# Patient Record
Sex: Male | Born: 1955 | Race: White | Hispanic: No | Marital: Married | Smoking: Former smoker
Health system: Southern US, Community
[De-identification: ages and names within clinical notes are randomized; demographics above are authoritative.]

## PROBLEM LIST (undated history)

## (undated) DIAGNOSIS — T7840XA Allergy, unspecified, initial encounter: Secondary | ICD-10-CM

## (undated) DIAGNOSIS — J45909 Unspecified asthma, uncomplicated: Secondary | ICD-10-CM

## (undated) DIAGNOSIS — Z9989 Dependence on other enabling machines and devices: Secondary | ICD-10-CM

## (undated) DIAGNOSIS — E669 Obesity, unspecified: Secondary | ICD-10-CM

## (undated) DIAGNOSIS — I10 Essential (primary) hypertension: Secondary | ICD-10-CM

## (undated) DIAGNOSIS — R519 Headache, unspecified: Secondary | ICD-10-CM

## (undated) DIAGNOSIS — R0602 Shortness of breath: Secondary | ICD-10-CM

## (undated) DIAGNOSIS — R42 Dizziness and giddiness: Secondary | ICD-10-CM

## (undated) DIAGNOSIS — E785 Hyperlipidemia, unspecified: Secondary | ICD-10-CM

## (undated) DIAGNOSIS — G4730 Sleep apnea, unspecified: Secondary | ICD-10-CM

## (undated) DIAGNOSIS — R079 Chest pain, unspecified: Secondary | ICD-10-CM

## (undated) DIAGNOSIS — G4733 Obstructive sleep apnea (adult) (pediatric): Secondary | ICD-10-CM

## (undated) DIAGNOSIS — F419 Anxiety disorder, unspecified: Secondary | ICD-10-CM

## (undated) DIAGNOSIS — U071 COVID-19: Secondary | ICD-10-CM

## (undated) DIAGNOSIS — E119 Type 2 diabetes mellitus without complications: Secondary | ICD-10-CM

## (undated) DIAGNOSIS — K219 Gastro-esophageal reflux disease without esophagitis: Secondary | ICD-10-CM

## (undated) HISTORY — DX: Obesity, unspecified: E66.9

## (undated) HISTORY — DX: Anxiety disorder, unspecified: F41.9

## (undated) HISTORY — DX: Chest pain, unspecified: R07.9

## (undated) HISTORY — PX: CHOLECYSTECTOMY: SHX55

## (undated) HISTORY — DX: Unspecified asthma, uncomplicated: J45.909

## (undated) HISTORY — DX: Gastro-esophageal reflux disease without esophagitis: K21.9

## (undated) HISTORY — DX: Dizziness and giddiness: R42

## (undated) HISTORY — DX: Type 2 diabetes mellitus without complications: E11.9

## (undated) HISTORY — DX: Allergy, unspecified, initial encounter: T78.40XA

## (undated) HISTORY — PX: APPENDECTOMY: SHX54

## (undated) HISTORY — DX: Dependence on other enabling machines and devices: Z99.89

## (undated) HISTORY — DX: Shortness of breath: R06.02

## (undated) HISTORY — PX: OTHER SURGICAL HISTORY: SHX169

## (undated) HISTORY — DX: Hyperlipidemia, unspecified: E78.5

## (undated) HISTORY — DX: Sleep apnea, unspecified: G47.30

## (undated) HISTORY — DX: Obstructive sleep apnea (adult) (pediatric): G47.33

---

## 1997-09-08 ENCOUNTER — Inpatient Hospital Stay (HOSPITAL_COMMUNITY): Admission: RE | Admit: 1997-09-08 | Discharge: 1997-09-09 | Payer: Self-pay | Admitting: Neurosurgery

## 2006-07-01 ENCOUNTER — Ambulatory Visit (HOSPITAL_COMMUNITY): Admission: RE | Admit: 2006-07-01 | Discharge: 2006-07-01 | Payer: Self-pay | Admitting: General Surgery

## 2006-07-01 ENCOUNTER — Encounter (INDEPENDENT_AMBULATORY_CARE_PROVIDER_SITE_OTHER): Payer: Self-pay | Admitting: Specialist

## 2009-04-07 ENCOUNTER — Encounter: Admission: RE | Admit: 2009-04-07 | Discharge: 2009-04-07 | Payer: Self-pay | Admitting: Otolaryngology

## 2010-09-27 NOTE — Op Note (Signed)
NAME:  Andrew Dyer, Andrew Dyer NO.:  [id number]   MEDICAL RECORD NO.:  [id number]          PATIENT TYPE:  AMB   LOCATION:  DAY                          FACILITY:  [facility]   PHYSICIAN:  Anselm Pancoast. Weatherly, M.D.DATE OF BIRTH:  06-01-1955   DATE OF PROCEDURE:  07/01/2006  DATE OF DISCHARGE:                               OPERATIVE REPORT   PREOPERATIVE DIAGNOSIS:  Chronic cholecystitis.   POSTOPERATIVE DIAGNOSIS:  Chronic cholecystitis.   OPERATIONS:  Laparoscopic cholecystectomy with cholangiogram.   ANESTHESIA:  General anesthesia.   SURGEON:  Anselm Pancoast. Zachery Dakins, M.D.   ASSISTANT:  Ardeth Sportsman, MD.   INDICATIONS FOR PROCEDURE:  Andrew Dyer is an extremely heavy  individual, I have managed his wife's surgical problems in the last  several years; and the patient has had episodes of epigastric pain,  right upper quadrant, usually occurs postprandially, off-and-on for  approximately a year.  He had problems with what sounds like infectious  colitis about a year ago, lost weight; and he has gained that back.  He  had ultrasounds that have not shown stones, but he has had a  hepatobiliary scan that shows a very low ejection fracture; and Dr.  Charm Barges referred him to [name] for a cholecystectomy.  The patient has had a  previous appendectomy; and is Crestor 10 mg and Avapro.  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  He is a large individual.  His laboratory  studies showed a bilirubin 2.1, but otherwise was unremarkable.  He  weighs about 240 pounds; and he is about 5 feet 8 inches.  Preoperatively he was not acutely tender; and he was given 3 grams of  Unasyn and he has PAS stockings.   The patient was taken to the operative suite.  Induction of general  anesthesia, endotracheal tube. oral tube into the stomach, and then we  shaved  the subxiphoid around the umbilicus; and a little small area in  the right upper quadrant where we put the 5-mm trocars.  The abdomen was  then prepped with Betadine solution and draped in sterile manner.  I  made a small incision below the umbilicus, and I could feel a little  fascial defect right at the umbilicus; and we kind of carefully worked  through this, so that we just kind of opened the posterior fascia; and  could get a Kelly in through the posterior peritoneum; and then put two  tracts of sutures of #0 Vicryl upwards and downwards.   The Hasson cannula was introduced; and the patient's abdomen was just a  sea of fatty tissue with a very generous omentum; and you were not  actually able to see the gallbladder until the liver was elevated.  The  upper 10-mm trocar was placed in the subxiphoid area; and the lateral 5-  mm trocars were placed in the appropriate lateral position by Dr. Michaell Cowing.  In trying to grasp the gallbladder, it was trying to separate from  the  liver at the inferior; and we were kind of careful on not doing any type  of vigorous retraction; and because of his size it was necessary to put  him in a pretty steep reverse Trendelenburg rotated to the left.  In  this position, we could see the very fatty covered liver; and I opened  in the area I thought would be the cystic liver junction; and dissected  down; and could actually see the junction of the two.  The little artery  was also visualized; and it was a single clipped and then a clip placed  across the cystic duct gallbladder junction.  A small opening was made  just proximal and a Reddick catheter was introduced; in the proximal  cystic duct.  This was probably about a 3 cm cystic duct, that was kind  of long, and then certainly could be that of biliary dyskinesia.  There  was good flow into the duodenum.  You could see a little air bubble that  went in that went on out later.   The catheter was removed, the  cystic duct was triply clipped and then  divided.  We then put 3 clips around the cystic artery and divided it;  and then there was a posterior branch the cystic artery that was also  doubly clipped proximally and divided.  The gallbladder was freed from  its bed.  It had a lot of fatty tissue around it; and hemostasis  required a little more cauterization than usual because of this kind of  mesentery and fatty omentum-type blood supply to the gallbladder area.  The little area where the gallbladder had kind of separated from the  free liver bed towards the tip was cauterized after the gallbladder was  freed from its bed.  I placed the gallbladder in the EndoCatch bag and  irrigated, aspirated; and was comfortable that we had good hemostasis.   The camera was then switched to the upper 10-mm port.  No drains were  placed; and then the bag containing the gallbladder was grasped with  __________  and then brought out through the umbilicus.  The  gallbladder, itself, was not all that large; and whether there is any  actual small stones that are not, will be left to the pathologist.  The  reinspection revealed good hemostasis.  I put an #0 Vicryl figure-of-  eight in the fascia, in addition to the two sutures that had already  been placed superiorly and inferiorly.  I tied all of them and then  anesthetized fascia.  The 5-mm trocars were removed under direct vision,  and the upper 10-mm trocar withdrawn.  I did place a figure-of-eight in  the fascia; and the upper since you could drop a handle of pickups  easily through the fascia defect.   The patient subcutaneous wounds were closed with 4-0 Vicryl.  Benzoin  and Steri-Strips on the skin.  The patient required a lot of anesthetic  agents to keep him paralyzed during surgery; and he is thinking about  possibly going home; and I want to make sure that he is not nauseated; and he is definitely recovering from the anesthesia before we elect  to  let him go home.  He will be out-of-work for at least a week; and  hopefully this will relieve his symptoms that he has had these episodes  of biliary colic off-and-on over the last year.           ______________________________  Anselm Pancoast. Zachery Dakins, M.D.     WJW/MEDQ  D:  07/01/2006  T:  07/01/2006  Job:  [id number]

## 2010-12-02 ENCOUNTER — Inpatient Hospital Stay (HOSPITAL_COMMUNITY)
Admission: EM | Admit: 2010-12-02 | Discharge: 2010-12-05 | DRG: 690 | Disposition: A | Discharge status: Home / Self Care | Payer: Managed Care, Other (non HMO) | Attending: Internal Medicine | Admitting: Internal Medicine

## 2010-12-02 ENCOUNTER — Encounter (HOSPITAL_COMMUNITY): Payer: Self-pay

## 2010-12-02 ENCOUNTER — Emergency Department (HOSPITAL_COMMUNITY): Payer: Managed Care, Other (non HMO)

## 2010-12-02 DIAGNOSIS — E119 Type 2 diabetes mellitus without complications: Secondary | ICD-10-CM | POA: Diagnosis present

## 2010-12-02 DIAGNOSIS — Z79899 Other long term (current) drug therapy: Secondary | ICD-10-CM

## 2010-12-02 DIAGNOSIS — I10 Essential (primary) hypertension: Secondary | ICD-10-CM | POA: Diagnosis present

## 2010-12-02 DIAGNOSIS — N179 Acute kidney failure, unspecified: Secondary | ICD-10-CM | POA: Diagnosis present

## 2010-12-02 DIAGNOSIS — G4733 Obstructive sleep apnea (adult) (pediatric): Secondary | ICD-10-CM | POA: Diagnosis present

## 2010-12-02 DIAGNOSIS — N10 Acute tubulo-interstitial nephritis: Principal | ICD-10-CM | POA: Diagnosis present

## 2010-12-02 DIAGNOSIS — N138 Other obstructive and reflux uropathy: Secondary | ICD-10-CM | POA: Diagnosis present

## 2010-12-02 DIAGNOSIS — N401 Enlarged prostate with lower urinary tract symptoms: Secondary | ICD-10-CM | POA: Diagnosis present

## 2010-12-02 DIAGNOSIS — J45909 Unspecified asthma, uncomplicated: Secondary | ICD-10-CM | POA: Diagnosis present

## 2010-12-02 DIAGNOSIS — E7800 Pure hypercholesterolemia, unspecified: Secondary | ICD-10-CM | POA: Diagnosis present

## 2010-12-02 DIAGNOSIS — D649 Anemia, unspecified: Secondary | ICD-10-CM | POA: Diagnosis present

## 2010-12-02 DIAGNOSIS — Z7982 Long term (current) use of aspirin: Secondary | ICD-10-CM

## 2010-12-02 DIAGNOSIS — A498 Other bacterial infections of unspecified site: Secondary | ICD-10-CM | POA: Diagnosis present

## 2010-12-02 DIAGNOSIS — E785 Hyperlipidemia, unspecified: Secondary | ICD-10-CM | POA: Diagnosis present

## 2010-12-02 DIAGNOSIS — F411 Generalized anxiety disorder: Secondary | ICD-10-CM | POA: Diagnosis present

## 2010-12-02 DIAGNOSIS — G43909 Migraine, unspecified, not intractable, without status migrainosus: Secondary | ICD-10-CM | POA: Diagnosis present

## 2010-12-02 DIAGNOSIS — R339 Retention of urine, unspecified: Secondary | ICD-10-CM | POA: Diagnosis present

## 2010-12-02 DIAGNOSIS — N410 Acute prostatitis: Secondary | ICD-10-CM | POA: Diagnosis present

## 2010-12-02 HISTORY — DX: Essential (primary) hypertension: I10

## 2010-12-02 LAB — COMPREHENSIVE METABOLIC PANEL
AST: 39 U/L — ABNORMAL HIGH (ref 0–37)
BUN: 13 mg/dL (ref 6–23)
Chloride: 101 mEq/L (ref 96–112)
GFR calc Af Amer: >60 mL/min (ref >60)
GFR calc non Af Amer: >60 mL/min (ref >60)
Potassium: 4 mEq/L (ref 3.5–5.1)
Sodium: 136 mEq/L (ref 135–145)
Total Protein: 6.8 g/dL (ref 6.0–8.3)

## 2010-12-02 LAB — DIFFERENTIAL
Basophils Absolute: 0 10*3/uL (ref 0.0–0.1)
Basophils Relative: 0 % (ref 0–1)
Eosinophils Absolute: 0 10*3/uL (ref 0.0–0.7)
Monocytes Absolute: 1.4 10*3/uL — ABNORMAL HIGH (ref 0.1–1.0)
Neutrophils Relative %: 89 % — ABNORMAL HIGH (ref 43–77)

## 2010-12-02 LAB — PROCALCITONIN: Procalcitonin: 1.03 ng/mL

## 2010-12-02 LAB — CBC
Hemoglobin: 13.8 g/dL (ref 13.0–17.0)
MCHC: 34.6 g/dL (ref 30.0–36.0)
MCV: 83 fL (ref 78.0–100.0)
RBC: 4.81 MIL/uL (ref 4.22–5.81)
RDW: 13 % (ref 11.5–15.5)
WBC: 19.1 10*3/uL — ABNORMAL HIGH (ref 4.0–10.5)

## 2010-12-02 LAB — POCT I-STAT, CHEM 8
BUN: 10 mg/dL (ref 6–23)
Calcium, Ion: 1.1 mmol/L — ABNORMAL LOW (ref 1.12–1.32)
Chloride: 99 mEq/L (ref 96–112)
Creatinine, Ser: 1.3 mg/dL (ref 0.50–1.35)
Glucose, Bld: 171 mg/dL — ABNORMAL HIGH (ref 70–99)
Hemoglobin: 14.3 g/dL (ref 13.0–17.0)
Potassium: 4 mEq/L (ref 3.5–5.1)

## 2010-12-02 LAB — URINALYSIS, ROUTINE W REFLEX MICROSCOPIC: Glucose, UA: NEGATIVE mg/dL

## 2010-12-02 LAB — MAGNESIUM: Magnesium: 1.6 mg/dL (ref 1.5–2.5)

## 2010-12-02 LAB — URINE MICROSCOPIC-ADD ON

## 2010-12-03 LAB — BASIC METABOLIC PANEL
BUN: 18 mg/dL (ref 6–23)
Calcium: 8 mg/dL — ABNORMAL LOW (ref 8.4–10.5)
GFR calc Af Amer: >60 mL/min (ref >60)
GFR calc non Af Amer: 57 mL/min — ABNORMAL LOW (ref >60)
Glucose, Bld: 155 mg/dL — ABNORMAL HIGH (ref 70–99)
Potassium: 4.7 mEq/L (ref 3.5–5.1)
Sodium: 135 mEq/L (ref 135–145)

## 2010-12-03 LAB — LIPID PANEL
HDL: 34 mg/dL — ABNORMAL LOW (ref >39)
Triglycerides: 116 mg/dL (ref <150)
VLDL: 23 mg/dL (ref 0–40)

## 2010-12-03 LAB — CBC
HCT: 36.4 % — ABNORMAL LOW (ref 39.0–52.0)
Hemoglobin: 12.3 g/dL — ABNORMAL LOW (ref 13.0–17.0)
MCHC: 33.8 g/dL (ref 30.0–36.0)

## 2010-12-03 LAB — HEMOGLOBIN A1C
Hgb A1c MFr Bld: 8.9 % — ABNORMAL HIGH (ref <5.7)
Mean Plasma Glucose: 209 mg/dL — ABNORMAL HIGH (ref <117)

## 2010-12-03 LAB — TSH: TSH: 1.351 u[IU]/mL (ref 0.350–4.500)

## 2010-12-03 LAB — GLUCOSE, CAPILLARY
Glucose-Capillary: 139 mg/dL — ABNORMAL HIGH (ref 70–99)
Glucose-Capillary: 134 mg/dL — ABNORMAL HIGH (ref 70–99)

## 2010-12-03 LAB — DIFFERENTIAL
Basophils Relative: 0 % (ref 0–1)
Eosinophils Relative: 0 % (ref 0–5)

## 2010-12-03 NOTE — H&P (Signed)
NAME:  Andrew Dyer, Andrew Dyer NO.:  [id number]  MEDICAL RECORD NO.:  [id number]  LOCATION:  [facility]                         FACILITY:  [facility]  PHYSICIAN:  Standley Dakins, MD   DATE OF BIRTH:  17-Apr-1956  DATE OF ADMISSION:  12/02/2010 DATE OF DISCHARGE:                             HISTORY & PHYSICAL   PRIMARY CARE PHYSICIAN:  Samuel Jester, MD in [place], [place].  CHIEF COMPLAINT:  Chills and fullness in the bladder.  HISTORY OF PRESENT ILLNESS:  This patient is a pleasant 55 year old gentleman who reports that last night he started experiencing chills and malaise and flu-like symptoms.  He reports that he has felt a fullness in the bladder for the past several days.  He reports he has been experiencing urinary frequency with only a small amount of urinary output for the past several days.  He reported that he has been experiencing nausea and a feeling of being bloated in the abdomen for the past several days.  He reports no fever, but has been concerned about the chills that he started experiencing last night.  He reports that he has been drinking large amounts of caffeine for the past 7-10 days.  He reports no emesis.  He reports that he decided to come to the emergency department because of the chills.  The patient was seen in the emergency department and found to have a fever of 102.9.  His bladder was mildly distended and tender to palpation.  He was discovered to have an urinary tract infection and was clinically dehydrated.  Hospital admission was requested for further evaluation and treatment.  The patient has a history of hypertension, migraine headaches, and anxiety.  PAST MEDICAL HISTORY: 1. Allergic rhinitis. 2. Vertigo. 3. Migraine headaches. 4. Hyperlipidemia. 5. Hypertension. 6. Osteoarthritis. 7. Asthma. 8. Obstructive sleep apnea. 9. Morbid obesity.  PAST SURGICAL HISTORY:  Status post cholecystectomy.  HOME MEDICATIONS: 1. Advil 200 mg 3  tablets p.o. daily p.r.n. arthritis pain. 2. Multivitamins one p.o. daily. 3. ProAir HFA inhaler 2 puffs every 6 hours p.r.n. shortness of     breath. 4. Visine allergy eyedrops 1 drop daily as needed. 5. Maxalt ODT 1 tablet p.o. daily as needed. 6. Aspirin 81 mg p.o. daily. 7. Alprazolam 0.25 mg 1 tablet daily as needed for anxiety. 8. Losartan 50 mg 1 tablet p.o. daily. 9. Crestor 10 mg one p.o. daily. 10.Fexofenadine 180 mg one p.o. daily. 11.Omeprazole 40 mg one p.o. daily. 12.Meclizine 25 mg one p.o. every 6 hours p.r.n. dizziness.  ALLERGIES:  No known drug allergies.  FAMILY HISTORY:  The patient reports that his father died of pneumonia at the age of 59.  The patient reports that his brother died of an aneurysm at the age of 54.  SOCIAL HISTORY:  The patient is married with children.  He does not use tobacco, alcohol, or recreational drugs.  He works full-time.  REVIEW OF SYSTEMS:  Significant for all of the positives listed in the HPI, otherwise all systems reviewed completely and reported as negative.  PHYSICAL EXAMINATION:  CURRENT VITAL SIGNS:  Temperature 102.9 rectal, pulse 99, respirations 20, blood pressure 144/73, pulse ox is 96% on room air.  GENERAL:  This is a well-developed, well-nourished male.  He is awake, alert, cooperative in no distress. HEENT:  Normocephalic, atraumatic.  Mucous membranes dry.  Normal dentition. NECK:  Supple.  Thyroid soft.  No nodules or masses palpated. LUNGS:  Bilateral breath sounds.  Clear to auscultation. CARDIAC:  Shallow distant breath sounds and distant heart sounds. Normal S1 and S2 sounds. ABDOMEN:  Obese, soft, nondistended.  Mild suprapubic tenderness with deep palpation.  No guarding or rebound tenderness.  Bowel sounds present. EXTREMITIES:  No pretibial edema, cyanosis, or clubbing.  The patient does have a small abrasion on the left lower extremity.  LABORATORY DATA:  Lactic acid 1.6, procalcitonin 1.03.  White  blood cell count 19.1, hemoglobin 13.8, hematocrit 39.9, platelet count 205. Sodium 137, potassium 4.0, chloride 99, BUN 10, creatinine 1.30, glucose 171, calcium 1.10.  Urinalysis reveals 21-50 red blood cells per high- power field, white blood cells too numerous to count, many bacteria.  IMAGING STUDIES:  CT of the abdomen and pelvis without contrast negative for urinary tract stones, mild stranding about the kidneys is nonspecific, but could be due to pyelonephritis, marked enlargement of the prostate gland and fatty infiltration of the liver.  IMPRESSION: 1. Urinary tract infection. 2. Prostatitis. 3. Question of pyelonephritis. 4. Hypertension. 5. Obstructive sleep apnea. 6. Morbid obesity. 7. Hyperlipidemia. 8. Allergic rhinitis. 9. Anxiety disorder. 10.Chronic intermittent vertigo. 11.Gastroesophageal reflux disease. 12.Asthma. 13.Osteoarthritis and weightbearing arthropathy. 14.Moderate clinical dehydration. 15.Recent abrasion by a metal substance.  The patient is due for     tetanus vaccine 16.Obstructive sleep apnea. 17.Hyperglycemia.  PLAN: 1. The patient is going to be admitted into the hospital for IV  antibiotic treatment. 2. Aggressive IV fluid hydration has been ordered. 3. Tetanus vaccine has been ordered. 4. A Foley catheter has been placed and we will start the patient on     Flomax. 5. Resume home medications for hypertension and hyperlipidemia. 6. Resume nightly CPAP treatments in the hospital. 7. Gastrointestinal prophylaxis with Protonix p.o. daily. 8. We will monitor electrolytes closely and monitor leukocytosis     trend. 9. Resume home medications for migraine headaches as needed. 10.Check urine for GC and Chlamydia as the patient had complained of     burning with urination. 11.Continue to monitor and make adjustments to medical care as     required. 12.Check Hemoglobin A1c to screen for diabetes mellitus.     Standley Dakins,  MD     CJ/MEDQ  D:  12/02/2010  T:  12/02/2010  Job:  [id number]  cc:   Samuel Jester, MD  Electronically Signed by Standley Dakins  on 12/03/2010 11:06:48 PM

## 2010-12-04 LAB — COMPREHENSIVE METABOLIC PANEL
Albumin: 2.7 g/dL — ABNORMAL LOW (ref 3.5–5.2)
BUN: 11 mg/dL (ref 6–23)
Calcium: 8.3 mg/dL — ABNORMAL LOW (ref 8.4–10.5)
Creatinine, Ser: 0.96 mg/dL (ref 0.50–1.35)
GFR calc Af Amer: >60 mL/min (ref >60)
Glucose, Bld: 126 mg/dL — ABNORMAL HIGH (ref 70–99)
Potassium: 3.9 mEq/L (ref 3.5–5.1)
Total Protein: 6.2 g/dL (ref 6.0–8.3)

## 2010-12-04 LAB — CBC
HCT: 34.7 % — ABNORMAL LOW (ref 39.0–52.0)
Hemoglobin: 11.8 g/dL — ABNORMAL LOW (ref 13.0–17.0)
MCH: 28.8 pg (ref 26.0–34.0)
MCHC: 34 g/dL (ref 30.0–36.0)
MCV: 84.6 fL (ref 78.0–100.0)
RDW: 13.4 % (ref 11.5–15.5)

## 2010-12-04 LAB — GLUCOSE, CAPILLARY
Glucose-Capillary: 122 mg/dL — ABNORMAL HIGH (ref 70–99)
Glucose-Capillary: 106 mg/dL — ABNORMAL HIGH (ref 70–99)
Glucose-Capillary: 107 mg/dL — ABNORMAL HIGH (ref 70–99)

## 2010-12-04 LAB — URINE CULTURE: Culture  Setup Time: 201207231947

## 2010-12-05 LAB — CBC
Hemoglobin: 12.1 g/dL — ABNORMAL LOW (ref 13.0–17.0)
MCH: 28.9 pg (ref 26.0–34.0)
MCHC: 34.2 g/dL (ref 30.0–36.0)
MCV: 84.5 fL (ref 78.0–100.0)
Platelets: 182 10*3/uL (ref 150–400)
RBC: 4.19 MIL/uL — ABNORMAL LOW (ref 4.22–5.81)

## 2010-12-05 LAB — DIFFERENTIAL
Basophils Relative: 0 % (ref 0–1)
Eosinophils Absolute: 0.2 10*3/uL (ref 0.0–0.7)
Lymphs Abs: 1 10*3/uL (ref 0.7–4.0)
Monocytes Absolute: 0.7 10*3/uL (ref 0.1–1.0)
Monocytes Relative: 10 % (ref 3–12)
Neutrophils Relative %: 73 % (ref 43–77)

## 2010-12-05 LAB — GLUCOSE, CAPILLARY
Glucose-Capillary: 104 mg/dL — ABNORMAL HIGH (ref 70–99)
Glucose-Capillary: 128 mg/dL — ABNORMAL HIGH (ref 70–99)

## 2010-12-08 LAB — CULTURE, BLOOD (ROUTINE X 2)
Culture  Setup Time: 201207231840
Culture: NO GROWTH

## 2010-12-24 NOTE — Discharge Summary (Signed)
NAME:  Andrew Dyer, Andrew Dyer NO.:  [id number]  MEDICAL RECORD NO.:  [id number]  LOCATION:  5532                         FACILITY:  [facility]  PHYSICIAN:  Marcellus Scott, MD     DATE OF BIRTH:  1955-11-26  DATE OF ADMISSION:  12/02/2010 DATE OF DISCHARGE:  12/05/2010                              DISCHARGE SUMMARY   PRIMARY CARE PHYSICIAN:  Samuel Jester, MD  UROLOGIST:  Valetta Fuller, MD  DISCHARGE DIAGNOSES: 1. Escherichia coli acute pyelonephritis. 2. Acute prostatitis. 3. Prostatomegaly, benign prostatic hypertrophy with associated     urinary retention. 4. Newly diagnosed type 2 diabetes mellitus. 5. Anemia. 6. Obstructive sleep apnea, on nightly continuous positive airway     pressure. 7. Hypertension. 8. Anxiety disorder. 9. Hyperlipidemia. 10.History of allergic rhinitis. 11.History of vertigo. 12.History of migraine. 13.History of asthma. 14.Morbid obesity.  DISCHARGE MEDICATIONS: 1. Tylenol 650 mg p.o. q.4 h. p.r.n. pain. 2. Bactrim DS 1 tablet p.o. b.i.d. to complete a total 4 weeks course     of antibiotics. 3. Metformin 500 mg p.o. b.i.d. 4. Tamsulosin 0.4 mg p.o. daily. 5. Advil 600 mg p.o. daily p.r.n. pain. 6. Alprazolam 0.25 mg p.o. daily p.r.n. for anxiety. 7. Aspirin 81 mg p.o. daily. 8. Crestor 10 mg p.o. daily. 9. Fexofenadine 180 mg p.o. daily. 10.Losartan 50 mg p.o. daily. 11.Maxalt ODT 1 tablet p.o. daily p.r.n. for migraines. 12.Meclizine 25 mg p.o. q.6 h. p.r.n. for dizziness. 13.Multivitamins 1 tablet p.o. daily. 14.Omeprazole 40 mg p.o. daily. 15.ProAir 2 puffs inhaled q.6 h. p.r.n. for dyspnea. 16.Visine 1 drop in both eyes daily p.r.n. for allergies.  Discontinued Medication:  Aspirin 325 mg.  PROCEDURES:  Insertion of Foley catheter in the emergency room.  IMAGING: 1. CT of the abdomen and pelvis without contrast on December 02, 2010.     Impression:     a.     Negative for urinary tract stone.  Mild stranding about  the      kidneys is nonspecific but could be due to pyelonephritis.      Finding is most notable about the lower pole of the right kidney.     b.     Marked enlargement of the prostate gland.     c.     Fatty infiltration of the liver.  LABORATORY DATA:  CBC showed hemoglobin 12.1, hematocrit 35.4, white blood cells 7.1, platelets 182.  Urine cultures showed E. coli greater than 100,000 colonies per mL which is pansensitive.  Hepatic panel shows a bilirubin 1.6, albumin 2.7, otherwise within normal limits.  Blood cultures x2 are no growth to date.  Lipid panel only shows HDL of 34, hemoglobin A1c of 8.9, TSH 1.351.  Procalcitonin 1.03.  Venous lactate 1.6.  Admitting white blood cell of 19.1 thousand.  Admitting creatinine of 1.3.  Urinalysis showed too numerous to count white blood cells and many bacteria.  CONSULTATIONS:  Phone consultation with Dr. Mena Goes, with [facility].  DIET:  Diabetic diet.  ACTIVITIES:  Ad lib.  TODAY'S COMPLAINTS:  The patient indicates that he is feeling much better.  He is eating a small amount.  He is ambulating to the bathroom steadily.  He had some loose stools after each meal up to yesterday but that is improving.  PHYSICAL EXAMINATION:  GENERAL:  The patient is in no obvious distress, obese. VITAL SIGNS:  Temperature 97.7 degrees Fahrenheit, pulse 66 per minute and regular, respirations 16 per minute, blood pressure 137/84 mmHg, and saturating at 95% on room air. RESPIRATORY:  Clear. CARDIOVASCULAR:  First and second heart sounds heard and regular. ABDOMEN:  Obese, nontender, soft and normal bowel sounds heard. CENTRAL NERVOUS SYSTEM:  The patient is awake, alert, oriented x3 with no focal neurological deficits.  HOSPITAL COURSE:  Mr. Hansson is a 55 year old gentleman with history of hypertension, obstructive sleep apnea on nightly CPAP, hyperlipidemia, and asthma who presented with bladder fullness, urinary frequency, and  decreased urine stream, malaise, and flu-like symptoms. He was found to have a fever of 102.9 degrees Fahrenheit and features of urinary retention.  The [facility] were requested to admitted him for further evaluation and management.  1. Acute E. coli pyelonephritis.  The patient was admitted to the     hospital.  His cultures were sent off, and he was empirically     started on IV Rocephin and today is day #3.  He will complete a     total of 7 days of antibiotics for this indication. 2. Acute prostatitis.  The patient had some deep-seated rectal pain on     admission which has significantly improved.  He has a Foley     catheter.  His case was discussed with Dr. Mena Goes from [facility] yesterday.  Dr. Mena Goes recommended discharging the     patient with the Foley catheter and continue the Flomax which was     started in the hospital.  He also recommends a total 4 weeks of     antibiotic therapy and Dr. Ellin Goodie office will call with an     appointment for the patient to be seen by them in a week's time. 3. Newly diagnosed type 2 diabetes mellitus.  The patient has received     diabetes education.  His CBGs ranged between 104-139 mg/dL and has     not required significant amounts of sliding scale insulin.  He will     be discharged on metformin which should help with his obesity as     well as diabetes. 4. Anemia, stable. 5. Obstructive sleep apnea.  Continue nightly CPAP. 6. Hypertension.  Controlled. 7. Mild acute renal failure on admission, possibly secondary to     urinary retention and dehydration, resolved.  DISPOSITION:  The patient is discharged home in stable condition.  FOLLOWUP RECOMMENDATIONS: 1. With Dr. Samuel Jester.  The patient or family is to call for an     appointment to be seen in 7-10 days' time. 2. With Dr. Barron Alvine.  Dr. Ellin Goodie office will call for an     appointment to be seen in a week's time but the family or the     patient is  also to call if they do not hear from MD's office.  Time taken in coordinating this discharge is 40 minutes.     Marcellus Scott, MD     AH/MEDQ  D:  12/05/2010  T:  12/06/2010  Job:  [id number]  cc:   Samuel Jester, MD Valetta Fuller, MD  Electronically Signed by Marcellus Scott MD on 12/24/2010 11:20:32 PM

## 2012-08-04 ENCOUNTER — Other Ambulatory Visit (HOSPITAL_COMMUNITY): Payer: Self-pay | Admitting: Cardiovascular Disease

## 2012-08-04 DIAGNOSIS — R079 Chest pain, unspecified: Secondary | ICD-10-CM

## 2012-08-17 ENCOUNTER — Ambulatory Visit (HOSPITAL_COMMUNITY): Payer: Managed Care, Other (non HMO)

## 2012-08-17 ENCOUNTER — Encounter (HOSPITAL_COMMUNITY): Payer: Managed Care, Other (non HMO)

## 2012-09-03 ENCOUNTER — Ambulatory Visit (HOSPITAL_COMMUNITY)
Admission: RE | Admit: 2012-09-03 | Discharge: 2012-09-03 | Disposition: A | Discharge status: Home / Self Care | Payer: BC Managed Care – PPO | Source: Ambulatory Visit | Attending: Cardiovascular Disease | Admitting: Cardiovascular Disease

## 2012-09-03 DIAGNOSIS — R079 Chest pain, unspecified: Secondary | ICD-10-CM

## 2012-09-03 DIAGNOSIS — E785 Hyperlipidemia, unspecified: Secondary | ICD-10-CM

## 2012-09-03 HISTORY — DX: Chest pain, unspecified: R07.9

## 2012-09-03 HISTORY — DX: Hyperlipidemia, unspecified: E78.5

## 2012-09-03 MED ORDER — TECHNETIUM TC 99M SESTAMIBI GENERIC - CARDIOLITE: 10.4000 | Freq: Once | INTRAVENOUS | Status: DC | PRN

## 2012-09-03 MED ORDER — TECHNETIUM TC 99M SESTAMIBI GENERIC - CARDIOLITE: 30.6000 | Freq: Once | INTRAVENOUS | Status: DC | PRN

## 2012-09-03 NOTE — Procedures (Addendum)
[facility] [address] [place] [address] [phone]  Cardiology Nuclear Med Study  Andrew Dyer is a 57 y.o. male     MRN : [id number]     DOB: Jan 17, 1956  Procedure Date: 09/03/2012  Nuclear Med Background Indication for Stress Test:  Evaluation for Ischemia History:  OSA with CPAP Cardiac Risk Factors: Hypertension, Lipids, NIDDM and Obesity  Symptoms:  Chest Pain, Dizziness, DOE, Near Syncope and SOB   Nuclear Pre-Procedure Caffeine/Decaff Intake:  10:00pm NPO After: 8:00am   IV Site: R Antecubital  IV 0.9% NS with Angio Cath:  22g  Chest Size (in):  XXL IV Started by: Koren Shiver, CNMT  Height: 5\' 9"  (1.753 m)  Cup Size: n/a  BMI:  Body mass index is 39.85 kg/(m^2). Weight:  270 lb (122.471 kg)   Tech Comments:  n/a    Nuclear Med Study 1 or 2 day study: 1 day  Stress Test Type:  Stress  Order Authorizing Provider:  Nanetta Batty, MD   Resting Radionuclide: Technetium 8m Sestamibi  Resting Radionuclide Dose: 10.4 mCi   Stress Radionuclide:  Technetium 90m Sestamibi  Stress Radionuclide Dose: 30.6 mCi           Stress Protocol Rest HR: 58 Stress HR: 153  Rest BP: 154/83 Stress BP:184/106  Exercise Time (min): 7:36 METS: 8.40   Predicted Max HR: 166 bpm % Max HR: 92.17 bpm Rate Pressure Product: 21308  Dose of Adenosine (mg):  n/a Dose of Lexiscan: n/a mg  Dose of Atropine (mg): n/a Dose of Dobutamine: n/a mcg/kg/min (at max HR)  Stress Test Technologist: Ernestene Mention, CCT Nuclear Technologist: Gonzella Lex, CNMT   Rest Procedure:  Myocardial perfusion imaging was performed at rest 45 minutes following the intravenous administration of Technetium 13m Sestamibi. Stress Procedure:  The patient performed treadmill exercise using a Bruce  Protocol for 7 minutes and 36 seconds. The patient stopped due to fatigue and shortness of breath. Patient denied any chest pain.  There were no significant  ST-T wave changes.  Technetium 21m Sestamibi was injected at peak exercise and myocardial perfusion imaging was performed after a brief delay.  Transient Ischemic Dilatation (Normal <1.22):  0.90 Lung/Heart Ratio (Normal <0.45):  0.35 QGS EDV:  94 ml QGS ESV:  30 ml LV Ejection Fraction: 68%     Rest ECG: NSR - Normal EKG  Stress ECG: No significant change from baseline ECG and There are scattered PVCs.  QPS Raw Data Images:  Normal; no motion artifact; normal heart/lung ratio. Stress Images:  There is a very mild reduction in inferior wall tracer uptake. Rest Images:  There is improved inferior wall perfusion Subtraction (SDS):  These findings are consistent with ischemia.  Impression Exercise Capacity:  Fair exercise capacity. BP Response:  Hypertensive blood pressure response. Clinical Symptoms:  There is dyspnea. ECG Impression:  No significant ST segment change suggestive of ischemia. Comparison with Prior Nuclear Study: No previous nuclear study performed  Overall Impression:  Low risk stress nuclear study , but there is evidence of very mild inferior wall ischemia.  LV Wall Motion:  NL LV Function; NL Wall Motion   Andrew Otterson, MD  09/03/2012 1:28 PM

## 2012-09-03 NOTE — Progress Notes (Signed)
[facility]   2D echo completed 09/03/2012.   Cindy Shontelle Muska, RDCS  

## 2012-09-24 ENCOUNTER — Encounter: Payer: Self-pay | Admitting: Pharmacist Clinician (PhC)/ Clinical Pharmacy Specialist

## 2012-09-27 ENCOUNTER — Encounter: Payer: Self-pay | Admitting: Cardiovascular Disease

## 2012-09-27 ENCOUNTER — Ambulatory Visit (INDEPENDENT_AMBULATORY_CARE_PROVIDER_SITE_OTHER): Payer: BC Managed Care – PPO | Admitting: Cardiovascular Disease

## 2012-09-27 VITALS — BP 150/100 | HR 52 | Ht 69.0 in | Wt 252.0 lb

## 2012-09-27 DIAGNOSIS — G4733 Obstructive sleep apnea (adult) (pediatric): Secondary | ICD-10-CM

## 2012-09-27 DIAGNOSIS — R079 Chest pain, unspecified: Secondary | ICD-10-CM

## 2012-09-27 DIAGNOSIS — I10 Essential (primary) hypertension: Secondary | ICD-10-CM

## 2012-09-27 DIAGNOSIS — E785 Hyperlipidemia, unspecified: Secondary | ICD-10-CM

## 2012-09-27 DIAGNOSIS — E119 Type 2 diabetes mellitus without complications: Secondary | ICD-10-CM | POA: Insufficient documentation

## 2012-09-27 NOTE — Assessment & Plan Note (Signed)
The patient had a Myoview stress test which was completely normal. He had a 2-D echo that was normal as well. An MCO T.was performed because of dizziness that was normal as well.

## 2012-09-27 NOTE — Assessment & Plan Note (Signed)
On statin therapy followed by Dr. Charm Barges

## 2012-09-27 NOTE — Assessment & Plan Note (Signed)
Blood pressure mildly elevated today because the patient was rushing to get to his office visit. He is on antihypertensive medications.

## 2012-09-27 NOTE — Progress Notes (Signed)
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 He is referred by Dr. Charm Barges for cardiovascular evaluation because of chest pain, shortness of breath and dizziness.   His risk factors include treated diabetes, hypertension and hyperlipidemia. There is no family history for heart disease. He has never had a heart attack or stroke. His past surgical history is remarkable for a remote appendectomy and cholecystectomy. He has had chest pain for about a year, sometimes occurring daily with left upper extremity radiation. He does have obstructive sleep apnea on CPAP. He also has episodes of dizziness but has never had syncope. He has had extensive neurologic workup including CT and neurologic evaluation as well as carotid Doppler studies.   Since his last office visit he has had a 2-D echo which was normal, Myoview stress test that was normal as well. He had an SGOT that showed no significant bradycardia or tachyarrhythmias. He has lost 20 pounds as a result of diet and exercise and feels clinically improved.      Current Outpatient Prescriptions  Medication Sig Dispense Refill  . ALPRAZolam (XANAX) 0.25 MG tablet Take 0.25 mg by mouth 3 (three) times daily as needed for sleep.      Marland Kitchen aspirin EC 81 MG tablet Take 81 mg by mouth daily.      Marland Kitchen atorvastatin (LIPITOR) 20 MG tablet Take 20 mg by mouth daily.      . Canagliflozin (INVOKANA) 100 MG TABS Take 100 mg by mouth daily.      . fexofenadine (ALLEGRA) 180 MG tablet Take 180 mg by mouth daily.      Marland Kitchen losartan (COZAAR) 50 MG tablet Take 50 mg by mouth daily.      .  meclizine (ANTIVERT) 25 MG tablet Take 25 mg by mouth 3 (three) times daily as needed.      . metFORMIN (GLUCOPHAGE) 500 MG tablet Take 500 mg by mouth 2 (two) times daily with a meal.      . omeprazole (PRILOSEC) 40 MG capsule Take 40 mg by mouth daily.      . rizatriptan (MAXALT) 10 MG tablet Take 10 mg by mouth as needed for migraine. May repeat in 2 hours if needed      . rosuvastatin (CRESTOR) 10 MG tablet Take 10 mg by mouth daily.      . tamsulosin (FLOMAX) 0.4 MG CAPS Take 0.4 mg by mouth daily.      Marland Kitchen testosterone cypionate (DEPOTESTOTERONE CYPIONATE) 200 MG/ML injection Inject into the muscle every 28 (twenty-eight) days.       No current facility-administered medications for this visit.    No Known Allergies  History   Social History  . Marital Status: Married    Spouse Name: Okey Regal    Number of Children: 3  . Years of Education: N/A   Occupational History  . Not on file.   Social History Main Topics  . Smoking status: Former Smoker    Types: Cigars  . Smokeless tobacco: Not on file     Comment: used to smoke an occasional cigar  . Alcohol Use: Not on file  . Drug  Use: Not on file  . Sexually Active: Not on file   Other Topics Concern  . Not on file   Social History Narrative  . No narrative on file     Review of Systems: General: negative for chills, fever, night sweats or weight changes.  Cardiovascular: negative for chest pain, dyspnea on exertion, edema, orthopnea, palpitations, paroxysmal nocturnal dyspnea or shortness of breath Dermatological: negative for rash Respiratory: negative for cough or wheezing Urologic: negative for hematuria Abdominal: negative for nausea, vomiting, diarrhea, bright red blood per rectum, melena, or hematemesis Neurologic: negative for visual changes, syncope, or dizziness All other systems reviewed and are otherwise negative except as noted above.    Blood pressure 150/100, pulse 52, height 5\' 9"  (1.753 m), weight 252 lb  (114.306 kg).  General appearance: alert and no distress Neck: no adenopathy, no carotid bruit, no JVD, supple, symmetrical, trachea midline and thyroid not enlarged, symmetric, no tenderness/mass/nodules Lungs: clear to auscultation bilaterally Heart: regular rate and rhythm, S1, S2 normal, no murmur, click, rub or gallop Extremities: extremities normal, atraumatic, no cyanosis or edema  EKG not performed today  ASSESSMENT AND PLAN:   Essential hypertension Blood pressure mildly elevated today because the patient was rushing to get to his office visit. He is on antihypertensive medications.  Hyperlipidemia On statin therapy followed by Dr. Charm Barges  Chest pain The patient had a Myoview stress test which was completely normal. He had a 2-D echo that was normal as well. An MCO T.was performed because of dizziness that was normal as well.      Runell Gess MD FACP,FACC,FAHA, [facility] 09/27/2012 5:17 PM

## 2012-09-27 NOTE — Patient Instructions (Signed)
Your physician wants you to follow-up in: 3 months with an extender and 6 months with Dr Berry. You will receive a reminder letter in the mail two months in advance. If you don't receive a letter, please call our office to schedule the follow-up appointment.  

## 2013-01-03 ENCOUNTER — Encounter: Payer: Self-pay | Admitting: Cardiology

## 2013-01-03 ENCOUNTER — Ambulatory Visit (INDEPENDENT_AMBULATORY_CARE_PROVIDER_SITE_OTHER): Payer: BC Managed Care – PPO | Admitting: Cardiology

## 2013-01-03 VITALS — BP 130/82 | HR 60 | Ht 69.0 in | Wt 258.5 lb

## 2013-01-03 DIAGNOSIS — I10 Essential (primary) hypertension: Secondary | ICD-10-CM

## 2013-01-03 DIAGNOSIS — R079 Chest pain, unspecified: Secondary | ICD-10-CM

## 2013-01-03 DIAGNOSIS — E785 Hyperlipidemia, unspecified: Secondary | ICD-10-CM

## 2013-01-03 DIAGNOSIS — E119 Type 2 diabetes mellitus without complications: Secondary | ICD-10-CM

## 2013-01-03 DIAGNOSIS — G4733 Obstructive sleep apnea (adult) (pediatric): Secondary | ICD-10-CM

## 2013-01-03 NOTE — Patient Instructions (Signed)
Follow up with Dr. Allyson Sabal in 3 months.  Continue with exercise.  Try dieting a little more, understanding stress is part of the problem.

## 2013-01-06 ENCOUNTER — Encounter: Payer: Self-pay | Admitting: Cardiology

## 2013-01-06 NOTE — Assessment & Plan Note (Signed)
LDL in July was 26 though not sure these are accurate cholesterol readings.

## 2013-01-06 NOTE — Assessment & Plan Note (Signed)
Blood pressure controlled. 

## 2013-01-06 NOTE — Assessment & Plan Note (Signed)
Followed by primary care they state he has been stable.

## 2013-01-06 NOTE — Progress Notes (Signed)
Patient ID: Andrew Dyer, male   DOB: 1956/05/10, 57 y.o.   MRN: [id number]       01/06/2013   PCP: Samuel Jester, DO   Chief Complaint  Patient presents with  . Follow-up    3 mo rov Reports no problems since last visit.    Primary Cardiologist:Dr. 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 He is referred by Dr. Charm Barges for cardiovascular evaluation because of chest pain, shortness of breath and dizziness.   On previous visit : His risk factors include treated diabetes, hypertension and hyperlipidemia. He has never had a heart attack or stroke. His past surgical history is remarkable for a remote appendectomy and cholecystectomy. He has had chest pain for about a year, sometimes occurring daily with left upper extremity radiation. He does have obstructive sleep apnea on CPAP. He also has episodes of dizziness but has never had syncope. He has had extensive neurologic workup including CT and neurologic evaluation as well as carotid Doppler studies.   Since his last office visit he has had a 2-D echo which was normal, Myoview stress test that was normal as well. He had an SGOT that showed no significant bradycardia or tachyarrhythmias. He has lost 20 pounds as a result of diet and exercise and feels clinically improved.  Today he has no complaints of shortness of breath he is a little frustrated that his weight loss has come to a standstill. He has a lot of stress with his job 10 hour shifts and despite this he is continuing to walk every afternoon he was congratulated on this.    We discussed    doing pretty well recommended eating more fruits and vegetables and less breaths to see if that would help also gave him a nutritionist card so that he could call if he felt the need  to have additional motivation as well as to help him get beyond this standstill with the dieting.  No Known Allergies  Current Outpatient Prescriptions  Medication Sig Dispense Refill  . ALPRAZolam (XANAX) 0.25 MG tablet Take 0.25 mg by mouth 3 (three) times daily as needed for sleep.      Marland Kitchen aspirin EC 81 MG tablet Take 81 mg by mouth daily.      . Canagliflozin (INVOKANA) 100 MG TABS Take 100 mg by mouth daily.      . fexofenadine (ALLEGRA) 180 MG tablet Take 180 mg by mouth daily.      Marland Kitchen losartan (COZAAR) 50 MG tablet Take 50 mg by mouth daily.      . meclizine (ANTIVERT) 25 MG tablet Take 25 mg by mouth 3 (three) times daily as needed.      . metFORMIN (GLUCOPHAGE) 500 MG tablet Take 500 mg by mouth 2 (two) times daily with a meal.      . omeprazole (PRILOSEC) 40 MG capsule Take 40 mg by mouth daily.      . rizatriptan (MAXALT) 10 MG tablet Take 10 mg by mouth as needed for migraine. May repeat in 2 hours if needed      . rosuvastatin (CRESTOR) 10 MG tablet Take 10 mg by mouth daily.      . tamsulosin (FLOMAX) 0.4 MG CAPS Take 0.4 mg by mouth daily.      Marland Kitchen  testosterone cypionate (DEPOTESTOTERONE CYPIONATE) 200 MG/ML injection Inject into the muscle every 28 (twenty-eight) days.       No current facility-administered medications for this visit.    Past Medical History  Diagnosis Date  . Hypertension   . Chest pain 09/03/2012    stress test see CV procedures, negative, echo with EF 55-60%   . Hyperlipidemia 09/03/2012    stress test - see CV procedures  . DM type 2 (diabetes mellitus, type 2)   . Obesity   . Dizziness and giddiness 09/01/2012    28 day event monitor - several episodes of sinus bradycardai (HR 40-50s)  . SOB (shortness of breath)   . OSA on CPAP     Past Surgical History  Procedure Laterality Date  . Appendectomy    . Cholecystectomy      ZOX:WRUEAVW:UJ colds or fevers, no weight changes Skin:no rashes or ulcers HEENT:no blurred vision, no congestion CV:see  HPI PUL:see HPI GI:no diarrhea constipation or melena, no indigestion GU:no hematuria, no dysuria MS:no joint pain, no claudication Neuro:no syncope, no lightheadedness Endo:+ diabetes, no thyroid disease  PHYSICAL EXAM BP 130/82  Pulse 60  Ht 5\' 9"  (1.753 m)  Wt 117.255 kg (258 lb 8 oz)  BMI 38.16 kg/m2 General:Pleasant affect, NAD Skin:Warm and dry, brisk capillary refill HEENT:normocephalic, sclera clear, mucus membranes moist Neck:supple, no JVD, no bruits  Heart:S1S2 RRR without murmur, gallup, rub or click Lungs:clear without rales, rhonchi, or wheezes WJX:BJYN, non tender, + BS, do not palpate liver spleen or masses Ext:no lower ext edema, 2+ pedal pulses, 2+ radial pulses Neuro:alert and oriented, MAE, follows commands, + facial symmetry  EKG:SR rate 60 no acute changes.  ASSESSMENT AND PLAN Chest pain No further chest pain, he does walk for exercise and no pain with walking. Echo and nuclear stress test were done in May of this year that were negative for ischemia.  Hyperlipidemia LDL in July was 26 though not sure these are accurate cholesterol readings.  Essential hypertension Blood pressure controlled.  Obstructive sleep apnea Wears his CPAP every night it is the mask.  Type 2 diabetes mellitus Followed by primary care they state he has been stable.   He'll followup in 6 months with Dr. Allyson Sabal he'll call if he has any problems in the meantime.

## 2013-01-06 NOTE — Assessment & Plan Note (Signed)
Wears his CPAP every night it is the mask.

## 2013-01-06 NOTE — Assessment & Plan Note (Signed)
No further chest pain, he does walk for exercise and no pain with walking. Echo and nuclear stress test were done in May of this year that were negative for ischemia.

## 2013-03-17 ENCOUNTER — Other Ambulatory Visit: Payer: Self-pay

## 2014-02-14 ENCOUNTER — Ambulatory Visit: Payer: BC Managed Care – PPO | Attending: Sports Medicine | Admitting: Physical Therapy

## 2014-02-14 DIAGNOSIS — Z5189 Encounter for other specified aftercare: Secondary | ICD-10-CM | POA: Insufficient documentation

## 2014-02-14 DIAGNOSIS — M25579 Pain in unspecified ankle and joints of unspecified foot: Secondary | ICD-10-CM | POA: Insufficient documentation

## 2014-02-14 DIAGNOSIS — M722 Plantar fascial fibromatosis: Secondary | ICD-10-CM | POA: Insufficient documentation

## 2014-02-14 DIAGNOSIS — E119 Type 2 diabetes mellitus without complications: Secondary | ICD-10-CM | POA: Insufficient documentation

## 2014-02-14 DIAGNOSIS — R2689 Other abnormalities of gait and mobility: Secondary | ICD-10-CM | POA: Diagnosis not present

## 2014-02-14 DIAGNOSIS — I10 Essential (primary) hypertension: Secondary | ICD-10-CM | POA: Diagnosis not present

## 2014-02-14 DIAGNOSIS — M25673 Stiffness of unspecified ankle, not elsewhere classified: Secondary | ICD-10-CM | POA: Diagnosis not present

## 2014-02-16 ENCOUNTER — Ambulatory Visit: Payer: BC Managed Care – PPO | Admitting: Physical Therapy

## 2014-02-16 DIAGNOSIS — Z5189 Encounter for other specified aftercare: Secondary | ICD-10-CM | POA: Diagnosis not present

## 2014-02-21 ENCOUNTER — Ambulatory Visit: Payer: BC Managed Care – PPO | Admitting: Physical Therapy

## 2014-02-21 DIAGNOSIS — Z5189 Encounter for other specified aftercare: Secondary | ICD-10-CM | POA: Diagnosis not present

## 2014-02-23 ENCOUNTER — Ambulatory Visit: Payer: BC Managed Care – PPO | Admitting: Physical Therapy

## 2014-02-23 DIAGNOSIS — Z5189 Encounter for other specified aftercare: Secondary | ICD-10-CM | POA: Diagnosis not present

## 2014-02-28 ENCOUNTER — Encounter: Payer: BC Managed Care – PPO | Admitting: Physical Therapy

## 2014-03-02 ENCOUNTER — Ambulatory Visit: Payer: BC Managed Care – PPO | Admitting: *Deleted

## 2014-03-02 DIAGNOSIS — Z5189 Encounter for other specified aftercare: Secondary | ICD-10-CM | POA: Diagnosis not present

## 2014-03-07 ENCOUNTER — Ambulatory Visit: Payer: BC Managed Care – PPO | Admitting: *Deleted

## 2014-03-07 DIAGNOSIS — Z5189 Encounter for other specified aftercare: Secondary | ICD-10-CM | POA: Diagnosis not present

## 2014-03-09 ENCOUNTER — Ambulatory Visit: Payer: BC Managed Care – PPO | Admitting: *Deleted

## 2014-03-09 DIAGNOSIS — Z5189 Encounter for other specified aftercare: Secondary | ICD-10-CM | POA: Diagnosis not present

## 2014-03-14 ENCOUNTER — Encounter: Payer: BC Managed Care – PPO | Admitting: Physical Therapy

## 2014-03-16 ENCOUNTER — Encounter: Payer: BC Managed Care – PPO | Admitting: *Deleted

## 2019-09-30 ENCOUNTER — Other Ambulatory Visit: Payer: Self-pay | Admitting: Urology

## 2019-10-18 ENCOUNTER — Other Ambulatory Visit: Payer: Self-pay

## 2019-10-18 ENCOUNTER — Encounter: Payer: Self-pay | Admitting: Dermatology

## 2019-10-18 ENCOUNTER — Ambulatory Visit: Payer: Commercial Managed Care - PPO | Admitting: Dermatology

## 2019-10-18 DIAGNOSIS — D229 Melanocytic nevi, unspecified: Secondary | ICD-10-CM

## 2019-10-18 DIAGNOSIS — D485 Neoplasm of uncertain behavior of skin: Secondary | ICD-10-CM

## 2019-10-18 DIAGNOSIS — L821 Other seborrheic keratosis: Secondary | ICD-10-CM | POA: Diagnosis not present

## 2019-10-18 DIAGNOSIS — L918 Other hypertrophic disorders of the skin: Secondary | ICD-10-CM

## 2019-10-18 DIAGNOSIS — D225 Melanocytic nevi of trunk: Secondary | ICD-10-CM | POA: Diagnosis not present

## 2019-10-18 NOTE — Progress Notes (Signed)
Follow-Up Visit   Subjective  Andrew Dyer is a 64 y.o. male who presents for the following: New Patient (Initial Visit) (Patient here today for skin check.  Patient has a few spot on left front scalp x 6 months no bleeding, spot on left side nose x few weeks no bleeding and check spot on right inner thigh x years no bleeding.).  growths Location: face and leg Duration:  Quality:  Associated Signs/Symptoms: Modifying Factors:  Severity:  Timing: Context:   The following portions of the chart were reviewed this encounter and updated as appropriate: Tobacco  Allergies  Meds  Problems  Med Hx  Surg Hx  Fam Hx      Objective  Well appearing patient in no apparent distress; mood and affect are within normal limits.  All skin waist up examined. Plus legs.   Assessment & Plan  Neoplasm of uncertain behavior of skin (3) Mid Forehead  Skin / nail biopsy Type of biopsy: tangential   Informed consent: discussed and consent obtained   Timeout: patient name, date of birth, surgical site, and procedure verified   Procedure prep:  Patient was prepped and draped in usual sterile fashion Prep type:  Chlorhexidine Anesthesia: the lesion was anesthetized in a standard fashion   Anesthetic:  1% lidocaine w/ epinephrine 1-100,000 local infiltration Instrument used: flexible razor blade   Hemostasis achieved with: ferric subsulfate   Outcome: patient tolerated procedure well   Post-procedure details: wound care instructions given    Specimen 1 - Surgical pathology Differential Diagnosis:scc/bcc Check Margins: No  Left Malar Cheek  Skin / nail biopsy Type of biopsy: tangential   Informed consent: discussed and consent obtained   Timeout: patient name, date of birth, surgical site, and procedure verified   Procedure prep:  Patient was prepped and draped in usual sterile fashion Prep type:  Chlorhexidine Anesthesia: the lesion was anesthetized in a standard fashion     Anesthetic:  1% lidocaine w/ epinephrine 1-100,000 local infiltration Instrument used: flexible razor blade   Hemostasis achieved with: ferric subsulfate   Outcome: patient tolerated procedure well   Post-procedure details: wound care instructions given    Specimen 2 - Surgical pathology Differential Diagnosis: scc/bcc Check Margins: No  Right Thigh - Anterior  Skin / nail biopsy Type of biopsy: tangential   Informed consent: discussed and consent obtained   Timeout: patient name, date of birth, surgical site, and procedure verified   Procedure prep:  Patient was prepped and draped in usual sterile fashion Prep type:  Chlorhexidine Anesthesia: the lesion was anesthetized in a standard fashion   Anesthetic:  1% lidocaine w/ epinephrine 1-100,000 local infiltration Instrument used: flexible razor blade   Hemostasis achieved with: ferric subsulfate   Outcome: patient tolerated procedure well   Post-procedure details: wound care instructions given    Specimen 3 - Surgical pathology Differential Diagnosis: scc/bcc Check Margins: No  Nevus Mid Back  Annual skin check  Skin tag (2) Left Breast; Right Axilla  No intervention for now.  Epidermal / dermal shaving - Left Breast, Right Axilla  Destruction of lesion - Left Breast, Right Axilla  Seborrheic keratosis (2) Mid Forehead; Left Forehead  Lesions concern pt's wife, may remove in future routine follow-up for Andrew Dyer date of birth 1956-05-08.  His wife and he are most concerned with enlarging dark spots on the left upper forehead frontal scalp.  These clinically fit benign seborrheic keratosis; if he does prefer removal this is best done  in the winter.  Of most concern to me are an enlarging cutaneous horn in the mid brow area, waxy growing plaque on the left inner cheek, and particularly a psoriasis-like 1.8cm crust on the right front thigh.  I took shave biopsy on each of these to rule out nonmelanoma skin  cancer and Andrew could get this result either on his MyChart at home on Friday or he can call the office to discuss this.  The remainder of his skin examination was clean from legs to scalp.  He has several benign seborrheic keratoses on the back, several moles that stick out, multiple skin tags; none of these require watching her removal unless there is obvious clinical change.

## 2019-10-18 NOTE — Patient Instructions (Signed)

## 2019-10-21 ENCOUNTER — Telehealth: Payer: Self-pay | Admitting: Dermatology

## 2019-10-21 NOTE — Telephone Encounter (Signed)
Patient is calling for pathology results.  Chart # [id number]

## 2019-10-24 NOTE — Telephone Encounter (Signed)
Phone call to patient with his pathology results.  Results given to patient's wife Arbie Cookey.

## 2019-10-28 ENCOUNTER — Other Ambulatory Visit: Payer: Self-pay

## 2019-10-28 ENCOUNTER — Encounter (HOSPITAL_BASED_OUTPATIENT_CLINIC_OR_DEPARTMENT_OTHER): Payer: Self-pay | Admitting: Urology

## 2019-10-28 NOTE — Progress Notes (Addendum)
Spoke w/ via phone for pre-op interview---pt Lab needs dos----    I stat 8, ekg           COVID test ------11-01-2019 at 1030 Arrive at -------1030 am 11-04-2019 NPO after ------midnight Medications to take morning of surgery -----omeprazole, tamsulosin, fexofenadine Diabetic medication -----none day of surgery Patient Special Instructions -----patient instructed to stay on 81 mg aspirin by dr winter Pre-Op special Istructions -----none Patient verbalized understanding of instructions that were given at this phone interview. Patient denies shortness of breath, chest pain, fever, cough a this phone interview.

## 2019-10-30 ENCOUNTER — Encounter: Payer: Self-pay | Admitting: Dermatology

## 2019-11-01 ENCOUNTER — Other Ambulatory Visit (HOSPITAL_COMMUNITY)
Admission: RE | Admit: 2019-11-01 | Discharge: 2019-11-01 | Disposition: A | Discharge status: Home / Self Care | Payer: Commercial Managed Care - PPO | Source: Ambulatory Visit | Attending: Urology | Admitting: Urology

## 2019-11-01 DIAGNOSIS — Z20822 Contact with and (suspected) exposure to covid-19: Secondary | ICD-10-CM | POA: Diagnosis not present

## 2019-11-01 DIAGNOSIS — Z01812 Encounter for preprocedural laboratory examination: Secondary | ICD-10-CM | POA: Diagnosis present

## 2019-11-01 LAB — SARS CORONAVIRUS 2 (TAT 6-24 HRS): SARS Coronavirus 2: NEGATIVE

## 2019-11-04 ENCOUNTER — Ambulatory Visit (HOSPITAL_BASED_OUTPATIENT_CLINIC_OR_DEPARTMENT_OTHER)
Admission: RE | Admit: 2019-11-04 | Discharge: 2019-11-04 | Disposition: A | Discharge status: Home / Self Care | Payer: Commercial Managed Care - PPO | Attending: Urology | Admitting: Urology

## 2019-11-04 ENCOUNTER — Encounter (HOSPITAL_BASED_OUTPATIENT_CLINIC_OR_DEPARTMENT_OTHER)
Admission: RE | Disposition: A | Discharge status: Home / Self Care | Payer: Self-pay | Source: Home / Self Care | Attending: Urology

## 2019-11-04 ENCOUNTER — Encounter (HOSPITAL_BASED_OUTPATIENT_CLINIC_OR_DEPARTMENT_OTHER): Payer: Self-pay | Admitting: Urology

## 2019-11-04 ENCOUNTER — Other Ambulatory Visit: Payer: Self-pay

## 2019-11-04 ENCOUNTER — Ambulatory Visit (HOSPITAL_BASED_OUTPATIENT_CLINIC_OR_DEPARTMENT_OTHER): Payer: Commercial Managed Care - PPO | Admitting: Anesthesiology

## 2019-11-04 DIAGNOSIS — Z6836 Body mass index (BMI) 36.0-36.9, adult: Secondary | ICD-10-CM | POA: Insufficient documentation

## 2019-11-04 DIAGNOSIS — I10 Essential (primary) hypertension: Secondary | ICD-10-CM | POA: Insufficient documentation

## 2019-11-04 DIAGNOSIS — F17200 Nicotine dependence, unspecified, uncomplicated: Secondary | ICD-10-CM | POA: Insufficient documentation

## 2019-11-04 DIAGNOSIS — D294 Benign neoplasm of scrotum: Secondary | ICD-10-CM | POA: Diagnosis not present

## 2019-11-04 DIAGNOSIS — Z9049 Acquired absence of other specified parts of digestive tract: Secondary | ICD-10-CM | POA: Insufficient documentation

## 2019-11-04 DIAGNOSIS — Z7984 Long term (current) use of oral hypoglycemic drugs: Secondary | ICD-10-CM | POA: Insufficient documentation

## 2019-11-04 DIAGNOSIS — N492 Inflammatory disorders of scrotum: Secondary | ICD-10-CM | POA: Diagnosis present

## 2019-11-04 DIAGNOSIS — E7800 Pure hypercholesterolemia, unspecified: Secondary | ICD-10-CM | POA: Diagnosis not present

## 2019-11-04 DIAGNOSIS — R519 Headache, unspecified: Secondary | ICD-10-CM | POA: Diagnosis not present

## 2019-11-04 DIAGNOSIS — E119 Type 2 diabetes mellitus without complications: Secondary | ICD-10-CM | POA: Insufficient documentation

## 2019-11-04 DIAGNOSIS — Z79899 Other long term (current) drug therapy: Secondary | ICD-10-CM | POA: Insufficient documentation

## 2019-11-04 DIAGNOSIS — G4730 Sleep apnea, unspecified: Secondary | ICD-10-CM | POA: Diagnosis not present

## 2019-11-04 DIAGNOSIS — E669 Obesity, unspecified: Secondary | ICD-10-CM | POA: Diagnosis not present

## 2019-11-04 DIAGNOSIS — Z7982 Long term (current) use of aspirin: Secondary | ICD-10-CM | POA: Insufficient documentation

## 2019-11-04 HISTORY — DX: COVID-19: U07.1

## 2019-11-04 HISTORY — PX: MASS EXCISION: SHX2000

## 2019-11-04 HISTORY — DX: Headache, unspecified: R51.9

## 2019-11-04 LAB — POCT I-STAT, CHEM 8
BUN: 8 mg/dL (ref 8–23)
Calcium, Ion: 1.21 mmol/L (ref 1.15–1.40)
Chloride: 99 mmol/L (ref 98–111)
Creatinine, Ser: 1.1 mg/dL (ref 0.61–1.24)
Glucose, Bld: 135 mg/dL — ABNORMAL HIGH (ref 70–99)
HCT: 43 % (ref 39.0–52.0)
Hemoglobin: 14.6 g/dL (ref 13.0–17.0)
Potassium: 4.3 mmol/L (ref 3.5–5.1)
Sodium: 142 mmol/L (ref 135–145)
TCO2: 32 mmol/L (ref 22–32)

## 2019-11-04 LAB — GLUCOSE, CAPILLARY: Glucose-Capillary: 136 mg/dL — ABNORMAL HIGH (ref 70–99)

## 2019-11-04 SURGERY — EXCISION MASS
Anesthesia: General | Site: Scrotum

## 2019-11-04 MED ORDER — OXYCODONE HCL 5 MG PO TABS: 5.0000 mg | ORAL_TABLET | Freq: Once | ORAL | Status: DC | PRN

## 2019-11-04 MED ORDER — PROPOFOL 10 MG/ML IV BOLUS
INTRAVENOUS | Status: DC | PRN
  Administered 2019-11-04: 200 mg via INTRAVENOUS

## 2019-11-04 MED ORDER — PROPOFOL 10 MG/ML IV BOLUS
INTRAVENOUS | Status: AC
  Filled 2019-11-04: qty 20

## 2019-11-04 MED ORDER — ONDANSETRON HCL 4 MG/2ML IJ SOLN
INTRAMUSCULAR | Status: DC | PRN
  Administered 2019-11-04: 4 mg via INTRAVENOUS

## 2019-11-04 MED ORDER — FENTANYL CITRATE (PF) 100 MCG/2ML IJ SOLN: 25.0000 ug | INTRAMUSCULAR | Status: DC | PRN

## 2019-11-04 MED ORDER — ONDANSETRON HCL 4 MG/2ML IJ SOLN
INTRAMUSCULAR | Status: AC
  Filled 2019-11-04: qty 2

## 2019-11-04 MED ORDER — CEFAZOLIN SODIUM-DEXTROSE 2-4 GM/100ML-% IV SOLN
2.0000 g | Freq: Once | INTRAVENOUS | Status: AC
  Administered 2019-11-04: 2 g via INTRAVENOUS

## 2019-11-04 MED ORDER — MIDAZOLAM HCL 5 MG/5ML IJ SOLN
INTRAMUSCULAR | Status: DC | PRN
  Administered 2019-11-04: 2 mg via INTRAVENOUS

## 2019-11-04 MED ORDER — LACTATED RINGERS IV SOLN
INTRAVENOUS | Status: DC
  Administered 2019-11-04: 1000 mL via INTRAVENOUS

## 2019-11-04 MED ORDER — PROMETHAZINE HCL 25 MG/ML IJ SOLN: 6.2500 mg | INTRAMUSCULAR | Status: DC | PRN

## 2019-11-04 MED ORDER — FENTANYL CITRATE (PF) 100 MCG/2ML IJ SOLN
INTRAMUSCULAR | Status: DC | PRN
  Administered 2019-11-04: 25 ug via INTRAVENOUS
  Administered 2019-11-04 (×3): 50 ug via INTRAVENOUS
  Administered 2019-11-04: 25 ug via INTRAVENOUS

## 2019-11-04 MED ORDER — MIDAZOLAM HCL 2 MG/2ML IJ SOLN
INTRAMUSCULAR | Status: AC
  Filled 2019-11-04: qty 2

## 2019-11-04 MED ORDER — BUPIVACAINE HCL 0.5 % IJ SOLN
INTRAMUSCULAR | Status: DC | PRN
  Administered 2019-11-04: 10 mL

## 2019-11-04 MED ORDER — CEFAZOLIN SODIUM-DEXTROSE 2-4 GM/100ML-% IV SOLN
INTRAVENOUS | Status: AC
  Filled 2019-11-04: qty 100

## 2019-11-04 MED ORDER — DEXAMETHASONE SODIUM PHOSPHATE 10 MG/ML IJ SOLN
INTRAMUSCULAR | Status: DC | PRN
  Administered 2019-11-04: 4 mg via INTRAVENOUS

## 2019-11-04 MED ORDER — LIDOCAINE 2% (20 MG/ML) 5 ML SYRINGE
INTRAMUSCULAR | Status: DC | PRN
  Administered 2019-11-04: 100 mg via INTRAVENOUS

## 2019-11-04 MED ORDER — FENTANYL CITRATE (PF) 100 MCG/2ML IJ SOLN
INTRAMUSCULAR | Status: AC
  Filled 2019-11-04: qty 2

## 2019-11-04 MED ORDER — LIDOCAINE 2% (20 MG/ML) 5 ML SYRINGE
INTRAMUSCULAR | Status: AC
  Filled 2019-11-04: qty 5

## 2019-11-04 MED ORDER — OXYCODONE HCL 5 MG/5ML PO SOLN: 5.0000 mg | Freq: Once | ORAL | Status: DC | PRN

## 2019-11-04 MED ORDER — DEXAMETHASONE SODIUM PHOSPHATE 10 MG/ML IJ SOLN
INTRAMUSCULAR | Status: AC
  Filled 2019-11-04: qty 1

## 2019-11-04 MED ORDER — HYDROCODONE-ACETAMINOPHEN 5-325 MG PO TABS: 1.0000 | ORAL_TABLET | ORAL | 0 refills | Status: DC | PRN

## 2019-11-04 SURGICAL SUPPLY — 40 items
ADH SKN CLS APL DERMABOND .7 (GAUZE/BANDAGES/DRESSINGS) ×1
BLADE CLIPPER SENSICLIP SURGIC (BLADE) IMPLANT
BLADE HEX COATED 2.75 (ELECTRODE) ×3 IMPLANT
BLADE SURG 15 STRL LF DISP TIS (BLADE) ×1 IMPLANT
BLADE SURG 15 STRL SS (BLADE) ×3
BNDG COHESIVE 2X5 TAN STRL LF (GAUZE/BANDAGES/DRESSINGS) ×3 IMPLANT
BNDG GAUZE ELAST 4 BULKY (GAUZE/BANDAGES/DRESSINGS) ×3 IMPLANT
COVER BACK TABLE 60X90IN (DRAPES) ×3 IMPLANT
COVER MAYO STAND STRL (DRAPES) ×3 IMPLANT
COVER WAND RF STERILE (DRAPES) ×3 IMPLANT
DERMABOND ADVANCED (GAUZE/BANDAGES/DRESSINGS) ×2
DERMABOND ADVANCED .7 DNX12 (GAUZE/BANDAGES/DRESSINGS) ×1 IMPLANT
DRAIN PENROSE 0.25X18 (DRAIN) IMPLANT
DRAIN PENROSE 0.5X18 (DRAIN) IMPLANT
DRAPE LAPAROTOMY 100X72 PEDS (DRAPES) ×3 IMPLANT
ELECT REM PT RETURN 9FT ADLT (ELECTROSURGICAL) ×3
ELECTRODE REM PT RTRN 9FT ADLT (ELECTROSURGICAL) ×1 IMPLANT
GLOVE BIOGEL M STRL SZ7.5 (GLOVE) ×3 IMPLANT
GLOVE BIOGEL PI IND STRL 7.5 (GLOVE) ×1 IMPLANT
GLOVE BIOGEL PI INDICATOR 7.5 (GLOVE) ×2
GOWN STRL REUS W/TWL XL LVL3 (GOWN DISPOSABLE) ×3 IMPLANT
NEEDLE HYPO 22GX1.5 SAFETY (NEEDLE) ×3 IMPLANT
NS IRRIG 500ML POUR BTL (IV SOLUTION) ×3 IMPLANT
PENCIL BUTTON HOLSTER BLD 10FT (ELECTRODE) ×3 IMPLANT
SET BASIN DAY SURGERY F.S. (CUSTOM PROCEDURE TRAY) ×3 IMPLANT
SUPPORT SCROTAL LG STRP (MISCELLANEOUS) ×2 IMPLANT
SUPPORTER ATHLETIC LG (MISCELLANEOUS) ×1
SUT CHROMIC 3 0 SH 27 (SUTURE) ×3 IMPLANT
SUT ETHILON 2 0 PS N (SUTURE) IMPLANT
SUT MNCRL AB 4-0 PS2 18 (SUTURE) ×3 IMPLANT
SUT SILK 2 0 30  PSL (SUTURE)
SUT SILK 2 0 30 PSL (SUTURE) IMPLANT
SYR BULB IRRIG 60ML STRL (SYRINGE) ×3 IMPLANT
SYR CONTROL 10ML LL (SYRINGE) ×3 IMPLANT
TOWEL OR 17X26 10 PK STRL BLUE (TOWEL DISPOSABLE) ×3 IMPLANT
TRAY DSU PREP LF (CUSTOM PROCEDURE TRAY) ×3 IMPLANT
TUBE CONNECTING 12'X1/4 (SUCTIONS) ×1
TUBE CONNECTING 12X1/4 (SUCTIONS) ×2 IMPLANT
UNDERPAD 30X30 (UNDERPADS AND DIAPERS) ×3 IMPLANT
YANKAUER SUCT BULB TIP NO VENT (SUCTIONS) ×3 IMPLANT

## 2019-11-04 NOTE — Anesthesia Preprocedure Evaluation (Addendum)
Anesthesia Evaluation  Patient identified by MRN, date of birth, ID band Patient awake    Reviewed: Allergy & Precautions, NPO status , Patient's Chart, lab work & pertinent test results  History of Anesthesia Complications Negative for: history of anesthetic complications  Airway Mallampati: II  TM Distance: >3 FB Neck ROM: Full    Dental  (+) Dental Advisory Given, Edentulous Upper   Pulmonary sleep apnea and Continuous Positive Airway Pressure Ventilation , former smoker,    Pulmonary exam normal        Cardiovascular hypertension, Pt. on medications Normal cardiovascular exam   '14 Myocardial perfusion - Overall Impression:  Low risk stress nuclear study , but there is evidence of very mild inferior wall ischemia.    Neuro/Psych  Headaches, negative psych ROS   GI/Hepatic negative GI ROS, Neg liver ROS,   Endo/Other  diabetes, Type 2, Oral Hypoglycemic Agents Obesity   Renal/GU negative Renal ROS     Musculoskeletal negative musculoskeletal ROS (+)   Abdominal (+) + obese,   Peds  Hematology negative hematology ROS (+)   Anesthesia Other Findings Covid test negative Covid infection in 04/2019 with GI symptoms, cough, and generalized aches for several days   Reproductive/Obstetrics                            Anesthesia Physical Anesthesia Plan  ASA: III  Anesthesia Plan: General   Post-op Pain Management:    Induction: Intravenous  PONV Risk Score and Plan: 2 and Treatment may vary due to age or medical condition, Ondansetron and Dexamethasone  Airway Management Planned: LMA  Additional Equipment: None  Intra-op Plan:   Post-operative Plan: Extubation in OR  Informed Consent: I have reviewed the patients History and Physical, chart, labs and discussed the procedure including the risks, benefits and alternatives for the proposed anesthesia with the patient or authorized  representative who has indicated his/her understanding and acceptance.     Dental advisory given  Plan Discussed with: CRNA and Anesthesiologist  Anesthesia Plan Comments:        Anesthesia Quick Evaluation

## 2019-11-04 NOTE — Op Note (Signed)
Operative Note  Preoperative diagnosis:  1.  3 x 3 x 3 cm scrotal lesion  Postoperative diagnosis: 1.  3 x 3 x 3 cm scrotal lesion  Procedure(s): 1.  Excision of scrotal lesion  Surgeon: Ellison Hughs, MD  Assistants:  None  Anesthesia:  General  Complications:  None  EBL: 10 mL  Specimens: 1.  Scrotal lesion  Drains/Catheters: 1.  None  Intraoperative findings:   1. The scrotal lesion was amputated and sent off for pathologic analysis  Indication:  Andrew Dyer is a 64 y.o. male with an enlarging and painful 3 x 3 cm scrotal lesion.  He has been consented for the above procedures, voices understanding and wishes to proceed.  Description of procedure:  After informed consent was obtained, the patient was brought to the operating room and general anesthesia was administered.  The patient was placed in the supine position and prepped and draped in usual sterile fashion.  A timeout was then performed.  The 3 x 3 x 3 cm scrotal lesion protruding from the inferomedial aspects of the scrotum was demarcated at its base and then amputated using electrocautery.  The specimen was then sent off for permanent section.  The scrotal incision was then closed in 2 layers and dressed with Dermabond.  The patient was then dressed with cotton fluffs and scrotal support.  He tolerated the procedure well and was transferred to the postanesthesia in stable condition.  Plan:  F/u in 2 weeks for a wound check.

## 2019-11-04 NOTE — Discharge Instructions (Signed)
HOME CARE INSTRUCTIONS FOR SCROTAL PROCEDURES  Wound Care & Hygiene: You may apply an ice bag to the scrotum for the first 24 hours.  This may help decrease swelling and soreness.  You may have a dressing held in place by an athletic supporter.  You may remove the dressing in 24 hours and shower in 48 hours.  Continue to use the athletic supporter or tight briefs for at least a week. Activity: Rest today - not necessarily flat bed rest.  Just take it easy.  You should not do strenuous activities until your follow-up visit with your doctor.  You may resume light activity in 48 hours.  Return to Work:  Your doctor will advise you of this depending on the type of work you do  Diet: Drink liquids or eat a light diet this evening.  You may resume a regular diet tomorrow.  General Expectations: You may have a small amount of bleeding.  The scrotum may be swollen or bruised for about a week.  Call your Doctor if these occur:  -persistent or heavy bleeding  -temperature of 101 degrees or more  -severe pain, not relieved by your pain medication  Wear your scrotal support or tight fitting underwear to minimize swelling.   Post Anesthesia Home Care Instructions  Activity: Get plenty of rest for the remainder of the day. A responsible individual must stay with you for 24 hours following the procedure.  For the next 24 hours, DO NOT: -Drive a car -Paediatric nurse -Drink alcoholic beverages -Take any medication unless instructed by your physician -Make any legal decisions or sign important papers.  Meals: Start with liquid foods such as gelatin or soup. Progress to regular foods as tolerated. Avoid greasy, spicy, heavy foods. If nausea and/or vomiting occur, drink only clear liquids until the nausea and/or vomiting subsides. Call your physician if vomiting continues.  Special Instructions/Symptoms: Your throat may feel dry or sore from the anesthesia or the breathing tube placed in your  throat during surgery. If this causes discomfort, gargle with warm salt water. The discomfort should disappear within 24 hours.

## 2019-11-04 NOTE — Transfer of Care (Signed)
Immediate Anesthesia Transfer of Care Note  Patient: Andrew Dyer  Procedure(s) Performed: EXCISION OF SCROTAL LESION (N/A Scrotum)  Patient Location: PACU  Anesthesia Type:General  Level of Consciousness: awake, alert  and oriented  Airway & Oxygen Therapy: Patient Spontanous Breathing and Patient connected to face mask oxygen  Post-op Assessment: Report given to RN and Post -op Vital signs reviewed and stable  Post vital signs: Reviewed and stable  Last Vitals:  Vitals Value Taken Time  BP 143/73 11/04/19 1335  Temp    Pulse 59 11/04/19 1336  Resp 21 11/04/19 1336  SpO2 100 % 11/04/19 1336  Vitals shown include unvalidated device data.  Last Pain:  Vitals:   11/04/19 1102  TempSrc: Oral  PainSc: 0-No pain      Patients Stated Pain Goal: 7 (74/93/55 2174)  Complications: No complications documented.

## 2019-11-04 NOTE — Anesthesia Procedure Notes (Signed)
Procedure Name: LMA Insertion Date/Time: 11/04/2019 12:53 PM Performed by: Genelle Bal, CRNA Pre-anesthesia Checklist: Patient identified, Emergency Drugs available, Suction available and Patient being monitored Patient Re-evaluated:Patient Re-evaluated prior to induction Oxygen Delivery Method: Circle system utilized Preoxygenation: Pre-oxygenation with 100% oxygen Induction Type: IV induction Ventilation: Mask ventilation without difficulty LMA: LMA with gastric port inserted LMA Size: 5.0 Number of attempts: 1 Airway Equipment and Method: Bite block Placement Confirmation: positive ETCO2 Tube secured with: Tape Dental Injury: Teeth and Oropharynx as per pre-operative assessment

## 2019-11-04 NOTE — H&P (Signed)
PRE-OP H&P  Office Visit Report     09/22/2019   --------------------------------------------------------------------------------   Andrew Dyer  MRN: [id number]  DOB: 04-10-1956, 64 year old Male   PRIMARY CARE:  Cynthia P. Melina Copa, DO  REFERRING:    PROVIDER:  Ellison Hughs, M.D.  LOCATION:  [facility] [phone]     --------------------------------------------------------------------------------   CC: I have a scrotal abscess.  HPI: Andrew Dyer is a 64 year-old male established patient who is here for a scrotal abscess.  The problem is on both sides. He has had the abscess for 10 years. He is having pain.   He has not had a scrotal abscess in the past. His condition does cause restriction of normal activities. He has not had surgery on his scrotum.   He has not had a fever. He has not had chills.   -Sebaceous cyst on scrotum has progressively enlarged over the past 8-10--become more painful and irritating with physical activity.      ALLERGIES: None   MEDICATIONS: ALPRAZolam 0.25 MG Oral Tablet 0 Oral  Aspirin Ec 81 mg tablet, delayed release 0 Oral  Atorvastatin Calcium 20 mg tablet Oral  Losartan Potassium 50 mg tablet 0 Oral  Metformin Hcl 500 mg tablet 2 Oral  Omeprazole 40 mg capsule,delayed release 1 Oral Daily  Tamsulosin Hcl 0.4 mg capsule 1 Oral Daily  Testosterone Cypionate 200 mg/ml vial 1 ml PO Q1M     GU PSH: No GU PSH    NON-GU PSH: Appendectomy - 2009 Cholecystectomy (open) - 2009 Neck Surgery (Unspecified)     GU PMH: BPH w/LUTS (Worsening, Chronic), PVR: 30 ml. Discussed behavioral/fluid modifications. If he tries for 6-8 weeks w/o improvement may than need to try OAB medication - 2018, Benign prostatic hyperplasia with urinary obstruction, - 2016 Encounter for Prostate Cancer screening (Chronic), PSA today. 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 He states he only developed sxs when PCP increased his dosage to 10 mg daily vs. Recommend he discuss with PCP about maybe decreasing dosage or changing mediation to see if LUTS improve. With gylcosuria voiding sxs can be exacerbated. - 2018 Encounter for general adult medical examination without abnormal findings, Encounter for preventive health examination - 2016 Sebaceous cyst, Sebaceous cyst - 2016 Personal history of other diseases of the circulatory system, History of hypertension - 2014 Personal history of other endocrine, nutritional and metabolic disease, History of hypercholesterolemia - 2014, History of diabetes mellitus, - 2014 Diabetes Type 2    FAMILY HISTORY: Death In The Family Father - Runs In Family Family Health Status - Mother's Age - Runs In Family Family Health Status Number - Runs In Family   SOCIAL HISTORY: Marital Status: Married Preferred Language: English; Ethnicity: Not Hispanic Or Latino; Race: White Current Smoking Status: Patient smokes occasionally. Smokes less than 1/2 pack per day.   Tobacco Use Assessment Completed: Used Tobacco in last 30 days? Does not use  smokeless tobacco. Drinks 2 drinks per week. Types of alcohol consumed: Beer.  Drinks 1 caffeinated drink per day.    REVIEW OF SYSTEMS:    GU Review Male:   Patient reports frequent urination, get up at night to urinate, and leakage of urine. Patient denies  hard to postpone urination, burning/ pain with urination, stream starts and stops, trouble starting your stream, have to strain to urinate , erection problems, and penile pain.  Gastrointestinal (Upper):   Patient denies nausea, vomiting, and indigestion/ heartburn.  Gastrointestinal (Lower):   Patient denies diarrhea and constipation.  Constitutional:   Patient denies fever, night sweats, weight loss, and fatigue.  Skin:   Patient denies skin rash/ lesion and itching.  Eyes:   Patient denies blurred vision and double vision.  Ears/ Nose/ Throat:   Patient denies sore throat and sinus problems.  Hematologic/Lymphatic:   Patient denies swollen glands and easy bruising.  Cardiovascular:   Patient denies leg swelling and chest pains.  Respiratory:   Patient denies cough and shortness of breath.  Endocrine:   Patient denies excessive thirst.  Musculoskeletal:   Patient denies back pain and joint pain.  Neurological:   Patient denies headaches and dizziness.  Psychologic:   Patient denies depression and anxiety.   VITAL SIGNS:      09/22/2019 03:05 PM  Weight 240 lb / 108.86 kg  Height 69 in / 175.26 cm  BP 130/90 mmHg  Temperature 98.7 F / 37.0 C  BMI 35.4 kg/m   GU PHYSICAL EXAMINATION:    Scrotum: A sebaceous cyst measuring 3 x 3 cm that is mildly tender to palpation. No lesions. No edema. No warts.   Epididymides: Right: no spermatocele, no masses, no cysts, no tenderness, no induration, no enlargement. Left: no spermatocele, no masses, no cysts, no tenderness, no induration, no enlargement.  Testes: No tenderness, no swelling, no enlargement left testes. No tenderness, no swelling, no enlargement right testes. Normal location left testes. Normal location right testes. No mass, no cyst, no varicocele, no hydrocele left testes. No mass, no cyst, no varicocele, no hydrocele right testes.  Urethral Meatus: Normal size. No lesion, no wart, no discharge, no polyp. Normal location.  Penis:  Circumcised, no warts, no cracks. No dorsal Peyronie's plaques, no left corporal Peyronie's plaques, no right corporal Peyronie's plaques, no scarring, no warts. No balanitis, no meatal stenosis.   MULTI-SYSTEM PHYSICAL EXAMINATION:    Constitutional: Well-nourished. No physical deformities. Normally developed. Good grooming.  Neurologic / Psychiatric: Oriented to time, oriented to place, oriented to person. No depression, no anxiety, no agitation.  Gastrointestinal: Obese abdomen. No mass, no tenderness, no rigidity.   Musculoskeletal: Normal gait and station of head and neck.     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 The risks included, but are not limited to, bleeding, infection, pain, MI, CVA, DVT, PE and the inherent risks of general anesthesia. He voices understanding and wishes to proceed.

## 2019-11-04 NOTE — Interval H&P Note (Signed)
History and Physical Interval Note:  11/04/2019 12:38 PM  Andrew Dyer  has presented today for surgery, with the diagnosis of SEBACEOUS CYST OF THE SCROTUM.  The various methods of treatment have been discussed with the patient and family. After consideration of risks, benefits and other options for treatment, the patient has consented to  Procedure(s): EXCISION OF SCROTAL LESION (N/A) as a surgical intervention.  The patient's history has been reviewed, patient examined, no change in status, stable for surgery.  I have reviewed the patient's chart and labs.  Questions were answered to the patient's satisfaction.     [name]

## 2019-11-04 NOTE — Anesthesia Postprocedure Evaluation (Signed)
Anesthesia Post Note  Patient: Andrew Dyer  Procedure(s) Performed: EXCISION OF SCROTAL LESION (N/A Scrotum)     Patient location during evaluation: PACU Anesthesia Type: General Level of consciousness: awake and alert Pain management: pain level controlled Vital Signs Assessment: post-procedure vital signs reviewed and stable Respiratory status: spontaneous breathing, nonlabored ventilation and respiratory function stable Cardiovascular status: blood pressure returned to baseline and stable Postop Assessment: no apparent nausea or vomiting Anesthetic complications: no   No complications documented.  Last Vitals:  Vitals:   11/04/19 1415 11/04/19 1430  BP: 135/71 (!) 145/94  Pulse: (!) 53 (!) 57  Resp: 19 15  Temp:    SpO2: 100% 96%    Last Pain:  Vitals:   11/04/19 1430  TempSrc:   PainSc: 0-No pain                 [name]

## 2019-11-07 ENCOUNTER — Encounter (HOSPITAL_BASED_OUTPATIENT_CLINIC_OR_DEPARTMENT_OTHER): Payer: Self-pay | Admitting: Urology

## 2019-11-08 LAB — SURGICAL PATHOLOGY

## 2019-12-12 NOTE — Addendum Note (Signed)
Addended by: Lavonna Monarch on: 12/12/2019 06:26 AM   Modules accepted: Level of Service

## 2020-02-20 ENCOUNTER — Ambulatory Visit: Payer: Commercial Managed Care - PPO | Admitting: Dermatology

## 2020-02-20 ENCOUNTER — Other Ambulatory Visit: Payer: Self-pay

## 2020-02-20 ENCOUNTER — Encounter: Payer: Self-pay | Admitting: Dermatology

## 2020-02-20 DIAGNOSIS — D485 Neoplasm of uncertain behavior of skin: Secondary | ICD-10-CM | POA: Diagnosis not present

## 2020-02-20 DIAGNOSIS — L570 Actinic keratosis: Secondary | ICD-10-CM

## 2020-02-20 DIAGNOSIS — D489 Neoplasm of uncertain behavior, unspecified: Secondary | ICD-10-CM

## 2020-02-20 NOTE — Patient Instructions (Signed)

## 2020-02-20 NOTE — Progress Notes (Signed)
LN2  x1 on left forearm

## 2020-02-24 ENCOUNTER — Telehealth: Payer: Self-pay | Admitting: Dermatology

## 2020-02-24 NOTE — Telephone Encounter (Signed)
Path to patients wife.

## 2020-02-24 NOTE — Telephone Encounter (Signed)
Patient's wife, Arbie Cookey, calling for pathology results from [place] last visit with Paulette Blanch, MD.

## 2020-05-07 ENCOUNTER — Encounter: Payer: Self-pay | Admitting: Dermatology

## 2020-05-07 NOTE — Progress Notes (Signed)
   Follow-Up Visit   Subjective  Andrew Dyer is a 64 y.o. male who presents for the following: Follow-up (WANTS BX LEFT FOREHEAD).  Growths Location: Left temple and behind left ear Duration:  Quality: Gotten larger Associated Signs/Symptoms: Modifying Factors:  Severity:  Timing: Context:   Objective  Well appearing patient in no apparent distress; mood and affect are within normal limits. Objective  Left Forehead: Heaped up bilobed dark brown 8 mm crust; shave biopsy then cautery     Objective  Left Posterior Neck: Pearly pink crusted 4 mm papule; shave biopsy and then cautery     Objective  Left Forearm - Posterior: 6 mm flat rough pink crust    A focused examination was performed including Head, neck, arms, back, chest.. Relevant physical exam findings are noted in the Assessment and Plan.   Assessment & Plan    Neoplasm of uncertain behavior (2) Left Forehead  Skin / nail biopsy Type of biopsy: tangential   Informed consent: discussed and consent obtained   Timeout: patient name, date of birth, surgical site, and procedure verified   Anesthesia: the lesion was anesthetized in a standard fashion   Anesthetic:  1% lidocaine w/ epinephrine 1-100,000 local infiltration Instrument used: flexible razor blade   Hemostasis achieved with: ferric subsulfate   Outcome: patient tolerated procedure well   Post-procedure details: sterile dressing applied and wound care instructions given   Dressing type: bandage and petrolatum    Specimen 1 - Surgical pathology Differential Diagnosis: Atypia Nevus Check Margins: No  Left Posterior Neck  Skin / nail biopsy Type of biopsy: tangential   Informed consent: discussed and consent obtained   Timeout: patient name, date of birth, surgical site, and procedure verified   Anesthesia: the lesion was anesthetized in a standard fashion   Anesthetic:  1% lidocaine w/ epinephrine 1-100,000 local  infiltration Instrument used: flexible razor blade   Hemostasis achieved with: ferric subsulfate   Outcome: patient tolerated procedure well   Post-procedure details: sterile dressing applied and wound care instructions given   Dressing type: bandage and petrolatum    Specimen 2 - Surgical pathology Differential Diagnosis: Atypia  Nevus Check Margins: No cautery  AK (actinic keratosis) Left Forearm - Posterior  Destruction of lesion - Left Forearm - Posterior Complexity: simple   Destruction method: cryotherapy   Informed consent: discussed and consent obtained   Lesion destroyed using liquid nitrogen: Yes   Cryotherapy cycles:  5 Outcome: patient tolerated procedure well with no complications        I, Janalyn Harder, MD, have reviewed all documentation for this visit.  The documentation on 05/07/20 for the exam, diagnosis, procedures, and orders are all accurate and complete.

## 2020-08-15 ENCOUNTER — Encounter: Payer: Self-pay | Admitting: Neurology

## 2020-08-21 ENCOUNTER — Encounter: Payer: Self-pay | Admitting: Gastroenterology

## 2020-09-14 ENCOUNTER — Encounter: Payer: Commercial Managed Care - PPO | Admitting: Gastroenterology

## 2020-09-19 NOTE — Progress Notes (Signed)
[facility] Clinic Note - Initial Visit   Date: 09/20/20  HARMAN LANGHANS MRN: [id number] DOB: 15-Jun-1955   Dear Dr. Melina Copa:  Thank you for your kind referral of Andrew Dyer for consultation of left arm numbness. Although his history is well known to you, please allow [name] to reiterate it for the purpose of our medical record. The patient was accompanied to the clinic by wife who also provides collateral information.     History of Present Illness: Andrew Dyer is a 65 y.o. right-handed male with history of cervical surgery C4-5, diabetes mellitus (HbA1c 5.5), OSA on CPAP, hypertension, hyperlipidemia, and anxiety presenting for evaluation of left arm numbness.   He had cervical surgery 20+ years ago by Dr. Joya Salm.  He was doing well until January, when he was pulling a heavy load and developed severe neck pain and radiating numbness down the left arm.  He was able to continue to do his work. He went to the ER two days later because of left sided chest pain and arm numbness.  Cardiac evaluation was negative.  Since this time, he has spells of numbness which starts between the shoulder blades, and radiates down the the left arm and into his hand.  The numbness occurs 3-4 times daily, lasting about a few minutes.  His neck pain has improved, but he has limited neck rotation, especially looking to the left.   He works at [employer] and does a lot of lifting, pulling, etc. He denies any weakness in the arms or legs.    Out-side paper records, electronic medical record, and images have been reviewed where available and summarized as:  Lab Results  Component Value Date   HGBA1C 8.9 (H) 12/02/2010   No results found for: [id number] Lab Results  Component Value Date   TSH 1.351 12/02/2010    Past Medical History:  Diagnosis Date  . Chest pain 09/03/2012   stress test see CV procedures, negative, echo with EF 55-60%   . COVID-19 [id number]   body  aches, diarrhea and cough for a few days all symptoms resolved  . DM type 2 (diabetes mellitus, type 2) ([place])   . Headache    migraines  . Hyperlipidemia 09/03/2012   stress test - see CV procedures  . Hypertension   . Obesity   . OSA on CPAP     Past Surgical History:  Procedure Laterality Date  . APPENDECTOMY  age 41  . cervical neck fusion  age 43's  . CHOLECYSTECTOMY  32's  . MASS EXCISION N/A 11/04/2019   Procedure: EXCISION OF SCROTAL LESION;  Surgeon: Ceasar Mons, MD;  Location: [facility];  Service: Urology;  Laterality: N/A;     Medications:  Outpatient Encounter Medications as of 09/20/2020  Medication Sig  . ALPRAZolam (XANAX) 0.25 MG tablet Take 0.25 mg by mouth 3 (three) times daily as needed for sleep.  Marland Kitchen aspirin EC 81 MG tablet Take 81 mg by mouth daily.  Marland Kitchen atorvastatin (LIPITOR) 20 MG tablet TAKE ONE (1) TABLET EACH DAY  . losartan (COZAAR) 50 MG tablet Take 50 mg by mouth daily.  . meclizine (ANTIVERT) 25 MG tablet Take 25 mg by mouth 3 (three) times daily as needed.  . metFORMIN (GLUCOPHAGE) 500 MG tablet Take 500 mg by mouth 2 (two) times daily with a meal.  . omeprazole (PRILOSEC) 40 MG capsule Take 40 mg by mouth daily.  . tamsulosin (FLOMAX) 0.4 MG CAPS  Take 0.4 mg by mouth daily.  Marland Kitchen testosterone cypionate (DEPOTESTOTERONE CYPIONATE) 200 MG/ML injection Inject into the muscle every 28 (twenty-eight) days.  . [DISCONTINUED] Canagliflozin (INVOKANA) 100 MG TABS Take 100 mg by mouth daily.  . [DISCONTINUED] fexofenadine (ALLEGRA) 180 MG tablet Take 180 mg by mouth daily.  . [DISCONTINUED] HYDROcodone-acetaminophen (NORCO) 5-325 MG tablet Take 1 tablet by mouth every 4 (four) hours as needed for moderate pain.  . [DISCONTINUED] rosuvastatin (CRESTOR) 10 MG tablet Take 10 mg by mouth daily.   No facility-administered encounter medications on file as of 09/20/2020.    Allergies: No Known Allergies  Family History: Family History   Problem Relation Age of Onset  . Aneurysm Brother   . Melanoma Brother   . Pneumonia Father     Social History: Social History   Tobacco Use  . Smoking status: Former Smoker    Types: Cigars  . Smokeless tobacco: Never Used  . Tobacco comment: used to smoke an occasional cigar  Vaping Use  . Vaping Use: Never used  Substance Use Topics  . Alcohol use: Yes    Comment: socially  . Drug use: Never   Social History   Social History Narrative   Leaving with wife   right handed    Vital Signs:  BP (!) 162/88   Pulse (!) 58   Ht 5\' 9"  (1.753 m)   Wt 244 lb (110.7 kg)   SpO2 97%   BMI 36.03 kg/m   Neurological Exam: MENTAL STATUS including orientation to time, place, person, recent and remote memory, attention span and concentration, language, and fund of knowledge is normal.  Speech is not dysarthric.  CRANIAL NERVES: II:  No visual field defects. III-IV-VI: Pupils equal round and reactive to light.  Normal conjugate, extra-ocular eye movements in all directions of gaze.  No nystagmus.  No ptosis.   V:  Normal facial sensation.    VII:  Normal facial symmetry and movements.   VIII:  Normal hearing and vestibular function.   IX-X:  Normal palatal movement.   XI:  Normal shoulder shrug and head rotation.   XII:  Normal tongue strength and range of motion, no deviation or fasciculation.  MOTOR:  No atrophy, fasciculations or abnormal movements.  No pronator drift.   Upper Extremity:  Right  Left  Deltoid  5/5   5/5   Biceps  5/5   5/5   Triceps  5/5   5/5   Infraspinatus 5/5  5/5  Medial pectoralis 5/5  5/5  Wrist extensors  5/5   5/5   Wrist flexors  5/5   5/5   Finger extensors  5/5   5/5   Finger flexors  5/5   5/5   Dorsal interossei  5/5   5/5   Abductor pollicis  5/5   5/5   Tone (Ashworth scale)  0  0   Lower Extremity:  Right  Left  Hip flexors  5/5   5/5   Hip extensors  5/5   5/5   Adductor 5/5  5/5  Abductor 5/5  5/5  Knee flexors  5/5   5/5    Knee extensors  5/5   5/5   Dorsiflexors  5/5   5/5   Plantarflexors  5/5   5/5   Toe extensors  5/5   5/5   Toe flexors  5/5   5/5   Tone (Ashworth scale)  0  0   MSRs:  Right  Left                  brachioradialis 2+  3+  biceps 2+  3+  triceps 2+  3+  patellar 3+  3+  ankle jerk 2+  2+  Hoffman no  no  plantar response down  down   SENSORY:  Normal and symmetric perception of light touch, pinprick, vibration, and proprioception.  Romberg's sign absent.   COORDINATION/GAIT: Normal finger-to- nose-finger and heel-to-shin.  Intact rapid alternating movements bilaterally.  Able to rise from a chair without using arms.  Gait narrow based and stable. Tandem and stressed gait intact.    IMPRESSION: Cervical canal stenosis and cervical radiculopathy causing neck pain and left arm numbness.  He has prior history of cervical surgery at C4-5 and I suspect he may have instability and/or new impingement below that level.  His exam shows asymmetric hyperreflexia in the left arm, suggestive of central canal stenosis.   PLAN/RECOMMENDATIONS:  MRI cerivcal spine without contrast.  Based on his imaging findings, he may need to follow-up with Dr. Joya Salm who did his prior surgery.    Thank you for allowing me to participate in patient's care.  If I can answer any additional questions, I would be pleased to do so.    Sincerely,    Commodore Bellew K. Posey Pronto, DO

## 2020-09-20 ENCOUNTER — Encounter: Payer: Self-pay | Admitting: Neurology

## 2020-09-20 ENCOUNTER — Other Ambulatory Visit: Payer: Self-pay

## 2020-09-20 ENCOUNTER — Ambulatory Visit (INDEPENDENT_AMBULATORY_CARE_PROVIDER_SITE_OTHER): Payer: Self-pay | Admitting: Neurology

## 2020-09-20 VITALS — BP 162/88 | HR 58 | Ht 69.0 in | Wt 244.0 lb

## 2020-09-20 DIAGNOSIS — Z9889 Other specified postprocedural states: Secondary | ICD-10-CM

## 2020-09-20 DIAGNOSIS — R292 Abnormal reflex: Secondary | ICD-10-CM

## 2020-09-20 DIAGNOSIS — M4802 Spinal stenosis, cervical region: Secondary | ICD-10-CM

## 2020-09-20 NOTE — Patient Instructions (Signed)
MRI cervical spine without contrast will be ordered.  

## 2020-10-04 ENCOUNTER — Ambulatory Visit
Admission: RE | Admit: 2020-10-04 | Discharge: 2020-10-04 | Disposition: A | Discharge status: Home / Self Care | Payer: Commercial Managed Care - PPO | Source: Ambulatory Visit | Attending: Neurology | Admitting: Neurology

## 2020-10-04 ENCOUNTER — Other Ambulatory Visit: Payer: Self-pay

## 2020-10-04 DIAGNOSIS — M4802 Spinal stenosis, cervical region: Secondary | ICD-10-CM

## 2020-10-04 DIAGNOSIS — Z9889 Other specified postprocedural states: Secondary | ICD-10-CM

## 2020-10-04 DIAGNOSIS — R292 Abnormal reflex: Secondary | ICD-10-CM

## 2020-10-05 ENCOUNTER — Telehealth: Payer: Self-pay | Admitting: Neurology

## 2020-10-05 DIAGNOSIS — M4802 Spinal stenosis, cervical region: Secondary | ICD-10-CM

## 2020-10-05 DIAGNOSIS — M5412 Radiculopathy, cervical region: Secondary | ICD-10-CM

## 2020-10-05 DIAGNOSIS — Z9889 Other specified postprocedural states: Secondary | ICD-10-CM

## 2020-10-05 NOTE — Telephone Encounter (Signed)
Called and informed patient that his MRI cervical spine shows multilevel foraminal stenosis in the cervical spine (see below).  Management options discussed including start neck PT or seeing Dr. Joya Salm for his opinion, patient prefers the latter.   1. Anterior fusion at C4-5 and C5-6. 2. Moderate foraminal narrowing bilaterally at C4-5 and C5-6 is worse on the right. 3. Severe left foraminal stenosis at C2-3. 4. Moderate central and moderate to severe foraminal stenosis bilaterally at C3-4. 5. Severe right and moderate left foraminal stenosis at C6-7. 6. Severe bilateral foraminal stenosis at C7-T1.  Andrew Baria K. Posey Pronto, DO

## 2020-10-09 ENCOUNTER — Other Ambulatory Visit: Payer: Self-pay | Admitting: *Deleted

## 2020-10-09 DIAGNOSIS — Z9889 Other specified postprocedural states: Secondary | ICD-10-CM

## 2020-10-09 DIAGNOSIS — M5412 Radiculopathy, cervical region: Secondary | ICD-10-CM

## 2020-10-09 NOTE — Telephone Encounter (Signed)
Sent/faxed referral pt to-- Dr. Leeroy Cha at [facility] for cervical radiculopathy fax [phone].

## 2020-10-24 ENCOUNTER — Ambulatory Visit (AMBULATORY_SURGERY_CENTER): Payer: Commercial Managed Care - PPO | Admitting: *Deleted

## 2020-10-24 ENCOUNTER — Other Ambulatory Visit: Payer: Self-pay

## 2020-10-24 VITALS — Ht 69.0 in | Wt 240.0 lb

## 2020-10-24 DIAGNOSIS — Z800 Family history of malignant neoplasm of digestive organs: Secondary | ICD-10-CM

## 2020-10-24 MED ORDER — SUTAB 1479-225-188 MG PO TABS: 24.0000 | ORAL_TABLET | ORAL | 0 refills | Status: DC

## 2020-10-24 NOTE — Progress Notes (Signed)
Pt verified name, DOB, address and insurance during PV today. Pt mailed instruction packet to included paper to complete and mail back to [facility] with addressed and stamped envelope, Emmi video, copy of consent form to read and not return, and instructions. SUT[employer]  coupon mailed in packet. PV completed over the phone. Pt encouraged to call with questions or issues. My Chart instructions to pt as well    No egg or soy allergy known to patient  No issues with past sedation with any surgeries or procedures Patient denies ever being told they had issues or difficulty with intubation  No FH of Malignant Hyperthermia No diet pills per patient No home 02 use per patient  No blood thinners per patient  Pt denies issues with constipation - ON METAMUCIL DAILY AND NO ISSUES  No A fib or A flutter  EMMI video to pt or via [place] 19 guidelines implemented in PV today with Pt and RN  Pt is fully vaccinated  for Covid   SUTAB  Coupon given to pt in PV today , Code to Pharmacy and  NO PA's for preps discussed with pt In PV today  Discussed with pt there will be an out-of-pocket cost for prep and that varies from $0 to 70 dollars   Due to the COVID-19 pandemic we are asking patients to follow certain guidelines.  Pt aware of COVID protocols and [facility] guidelines

## 2020-11-02 ENCOUNTER — Telehealth: Payer: Self-pay | Admitting: Neurology

## 2020-11-02 DIAGNOSIS — M5412 Radiculopathy, cervical region: Secondary | ICD-10-CM

## 2020-11-02 NOTE — Telephone Encounter (Signed)
Patient wife called and wants a referral sent a different neurosurgery group not [facility] they were not pleased with that group.

## 2020-11-02 NOTE — Telephone Encounter (Signed)
They are the only neurosurgery group in town. If they are interested, we can refer to [facility].

## 2020-11-05 ENCOUNTER — Ambulatory Visit: Payer: Commercial Managed Care - PPO | Admitting: Neurology

## 2020-11-06 NOTE — Telephone Encounter (Signed)
Called patient and informed patients wife that we can send a referral to [facility]. Referral has been placed and sent. Informed patients wife to allow a few days for referral to be received and processed. Patients wife verbalized understanding and had no further questions or concerns.

## 2020-11-06 NOTE — Telephone Encounter (Signed)
Called patient and left a message for a call back.  

## 2020-11-06 NOTE — Telephone Encounter (Signed)
Wife is retuning call to [facility] [phone]

## 2020-11-07 ENCOUNTER — Ambulatory Visit (AMBULATORY_SURGERY_CENTER): Payer: Commercial Managed Care - PPO | Admitting: Gastroenterology

## 2020-11-07 ENCOUNTER — Encounter: Payer: Self-pay | Admitting: Gastroenterology

## 2020-11-07 ENCOUNTER — Other Ambulatory Visit: Payer: Self-pay

## 2020-11-07 VITALS — BP 107/56 | HR 57 | Temp 98.2°F | Resp 20 | Ht 69.0 in | Wt 240.0 lb

## 2020-11-07 DIAGNOSIS — D123 Benign neoplasm of transverse colon: Secondary | ICD-10-CM | POA: Diagnosis not present

## 2020-11-07 DIAGNOSIS — Z1211 Encounter for screening for malignant neoplasm of colon: Secondary | ICD-10-CM

## 2020-11-07 DIAGNOSIS — D122 Benign neoplasm of ascending colon: Secondary | ICD-10-CM

## 2020-11-07 DIAGNOSIS — D120 Benign neoplasm of cecum: Secondary | ICD-10-CM

## 2020-11-07 DIAGNOSIS — Z800 Family history of malignant neoplasm of digestive organs: Secondary | ICD-10-CM | POA: Diagnosis not present

## 2020-11-07 DIAGNOSIS — D127 Benign neoplasm of rectosigmoid junction: Secondary | ICD-10-CM | POA: Diagnosis not present

## 2020-11-07 MED ORDER — SODIUM CHLORIDE 0.9 % IV SOLN: 500.0000 mL | Freq: Once | INTRAVENOUS | Status: DC

## 2020-11-07 NOTE — Op Note (Signed)
[facility] Patient Name: Andrew Dyer Procedure Date: 11/07/2020 8:35 AM MRN: [id number] Endoscopist: Remo Lipps P. Havery Moros , MD Age: 65 Referring MD:  Date of Birth: [birth year] Gender: Male Account #: [id number] Procedure:                Colonoscopy Indications:              Screening in patient at increased risk: Family                            history of 1st-degree relative with colorectal                            cancer (sister age 49s) Medicines:                Monitored Anesthesia Care Procedure:                Pre-Anesthesia Assessment:                           - Prior to the procedure, a History and Physical                            was performed, and patient medications and                            allergies were reviewed. The patient's tolerance of                            previous anesthesia was also reviewed. The risks                            and benefits of the procedure and the sedation                            options and risks were discussed with the patient.                            All questions were answered, and informed consent                            was obtained. Prior Anticoagulants: The patient has                            taken no previous anticoagulant or antiplatelet                            agents. ASA Grade Assessment: II - A patient with                            mild systemic disease. After reviewing the risks                            and benefits, the patient was deemed in  satisfactory condition to undergo the procedure.                           After obtaining informed consent, the colonoscope                            was passed under direct vision. Throughout the                            procedure, the patient's blood pressure, pulse, and                            oxygen saturations were monitored continuously. The                            CF [id number] #[id number] was introduced  through the anus                            and advanced to the the cecum, identified by                            appendiceal orifice and ileocecal valve. The                            colonoscopy was performed without difficulty. The                            patient tolerated the procedure well. The quality                            of the bowel preparation was good. The ileocecal                            valve, appendiceal orifice, and rectum were                            photographed. Scope In: 8:43:48 AM Scope Out: 9:00:33 AM Scope Withdrawal Time: 0 hours 15 minutes 29 seconds  Total Procedure Duration: 0 hours 16 minutes 45 seconds  Findings:                 The perianal and digital rectal examinations were                            normal.                           A 3 mm polyp was found in the cecum. The polyp was                            sessile. The polyp was removed with a cold snare.                            Resection and retrieval were complete.  Four flat and sessile polyps were found in the                            ascending colon. The polyps were 2 to 3 mm in size.                            These polyps were removed with a cold snare.                            Resection and retrieval were complete.                           A 3 mm polyp was found in the hepatic flexure. The                            polyp was sessile. The polyp was removed with a                            cold snare. Resection and retrieval were complete.                           A 5 mm polyp was found in the recto-sigmoid colon.                            The polyp was sessile. The polyp was removed with a                            cold snare. Resection and retrieval were complete.                           Internal hemorrhoids were found during                            retroflexion. The hemorrhoids were small.                           The exam was  otherwise without abnormality. Complications:            No immediate complications. Estimated blood loss:                            Minimal. Estimated Blood Loss:     Estimated blood loss was minimal. Impression:               - One 3 mm polyp in the cecum, removed with a cold                            snare. Resected and retrieved.                           - Four 2 to 3 mm polyps in the ascending colon,  removed with a cold snare. Resected and retrieved.                           - One 3 mm polyp at the hepatic flexure, removed                            with a cold snare. Resected and retrieved.                           - One 5 mm polyp at the recto-sigmoid colon,                            removed with a cold snare. Resected and retrieved.                           - Internal hemorrhoids.                           - The examination was otherwise normal. Recommendation:           - Patient has a contact number available for                            emergencies. The signs and symptoms of potential                            delayed complications were discussed with the                            patient. Return to normal activities tomorrow.                            Written discharge instructions were provided to the                            patient.                           - Resume previous diet.                           - Continue present medications.                           - Await pathology results. Remo Lipps P. Havery Moros, MD 11/07/2020 9:07:24 AM This report has been signed electronically.

## 2020-11-07 NOTE — Progress Notes (Signed)
[facility]  Pt's states no medical or surgical changes since previsit or office visit.  

## 2020-11-07 NOTE — Progress Notes (Signed)
Called to room to assist during endoscopic procedure.  Patient ID and intended procedure confirmed with present staff. Received instructions for my participation in the procedure from the performing physician.  

## 2020-11-07 NOTE — Progress Notes (Signed)
Report to PACU, RN, vss, BBS= Clear.  

## 2020-11-07 NOTE — Patient Instructions (Signed)
Handouts on polyps & hemorrhoids given to you today  Await pathology on polyps removed     YOU HAD AN ENDOSCOPIC PROCEDURE TODAY AT [facility]:   Refer to the procedure report that was given to you for any specific questions about what was found during the examination.  If the procedure report does not answer your questions, please call your gastroenterologist to clarify.  If you requested that your care partner not be given the details of your procedure findings, then the procedure report has been included in a sealed envelope for you to review at your convenience later.  YOU SHOULD EXPECT: Some feelings of bloating in the abdomen. Passage of more gas than usual.  Walking can help get rid of the air that was put into your GI tract during the procedure and reduce the bloating. If you had a lower endoscopy (such as a colonoscopy or flexible sigmoidoscopy) you may notice spotting of blood in your stool or on the toilet paper. If you underwent a bowel prep for your procedure, you may not have a normal bowel movement for a few days.  Please Note:  You might notice some irritation and congestion in your nose or some drainage.  This is from the oxygen used during your procedure.  There is no need for concern and it should clear up in a day or so.  SYMPTOMS TO REPORT IMMEDIATELY:  Following lower endoscopy (colonoscopy or flexible sigmoidoscopy):  Excessive amounts of blood in the stool  Significant tenderness or worsening of abdominal pains  Swelling of the abdomen that is new, acute  Fever of 100F or higher   For urgent or emergent issues, a gastroenterologist can be reached at any hour by calling [phone]. Do not use MyChart messaging for urgent concerns.    DIET:  We do recommend a small meal at first, but then you may proceed to your regular diet.  Drink plenty of fluids but you should avoid alcoholic beverages for 24 hours.  ACTIVITY:  You should plan to take it  easy for the rest of today and you should NOT DRIVE or use heavy machinery until tomorrow (because of the sedation medicines used during the test).    FOLLOW UP: Our staff will call the number listed on your records 48-72 hours following your procedure to check on you and address any questions or concerns that you may have regarding the information given to you following your procedure. If we do not reach you, we will leave a message.  We will attempt to reach you two times.  During this call, we will ask if you have developed any symptoms of COVID 19. If you develop any symptoms (ie: fever, flu-like symptoms, shortness of breath, cough etc.) before then, please call [phone].  If you test positive for Covid 19 in the 2 weeks post procedure, please call and report this information to [name].    If any biopsies were taken you will be contacted by phone or by letter within the next 1-3 weeks.  Please call us at [phone] if you have not heard about the biopsies in 3 weeks.    SIGNATURES/CONFIDENTIALITY: You and/or your care partner have signed paperwork which will be entered into your electronic medical record.  These signatures attest to the fact that that the information above on your After Visit Summary has been reviewed and is understood.  Full responsibility of the confidentiality of this discharge information lies with you and/or your  care-partner.  

## 2020-11-09 ENCOUNTER — Telehealth: Payer: Self-pay

## 2020-11-09 NOTE — Telephone Encounter (Signed)
Called [phone] and left a message we tried to reach pt for a follow up call. maw

## 2020-12-04 ENCOUNTER — Ambulatory Visit (INDEPENDENT_AMBULATORY_CARE_PROVIDER_SITE_OTHER): Payer: No Typology Code available for payment source

## 2020-12-04 ENCOUNTER — Other Ambulatory Visit: Payer: Self-pay

## 2020-12-04 ENCOUNTER — Encounter: Payer: Self-pay | Admitting: Orthopaedic Surgery

## 2020-12-04 ENCOUNTER — Ambulatory Visit (INDEPENDENT_AMBULATORY_CARE_PROVIDER_SITE_OTHER): Payer: No Typology Code available for payment source | Admitting: Orthopaedic Surgery

## 2020-12-04 VITALS — Ht 69.0 in | Wt 240.0 lb

## 2020-12-04 DIAGNOSIS — Z981 Arthrodesis status: Secondary | ICD-10-CM | POA: Diagnosis not present

## 2020-12-04 DIAGNOSIS — M542 Cervicalgia: Secondary | ICD-10-CM | POA: Diagnosis not present

## 2020-12-04 DIAGNOSIS — M4802 Spinal stenosis, cervical region: Secondary | ICD-10-CM | POA: Diagnosis not present

## 2020-12-04 NOTE — Progress Notes (Signed)
Office Visit Note   Patient: Andrew Dyer           Date of Birth: 27-Jul-1955           MRN: [id number] Visit Date: 12/04/2020              Requested by: Alda Berthold, DO [address] [place],  [place] [address] PCP: Scotty Court, DO   Assessment & Plan: Visit Diagnoses:  1. Neck pain   2. Hx of fusion of cervical spine   3. Foraminal stenosis of cervical region     Plan: Patient's MRI 10/05/2020 showed some left side disc osteophyte complex at C6-7 partially effacing the ventral CSF.  There was moderate left foraminal stenosis.  He does not have severe compression on the scan he does have some levels of foraminal compression multiple levels.  Some severe left C2-3 foraminal stenosis but is not symptomatic from this.  We will proceed with some physical therapy once approval is obtained for treatment of cervical spondylosis with paracentral disc protrusion on the left.  He can continue regular work without restrictions and I plan to recheck him in 4 to 6 weeks after he starts therapy.  Follow-Up Instructions: Office follow-up 4 to 6 weeks after he starts physical therapy..  Orders:  Orders Placed This Encounter  Procedures   XR Cervical Spine 2 or 3 views   Ambulatory referral to Physical Therapy   No orders of the defined types were placed in this encounter.     Procedures: No procedures performed   Clinical Data: No additional findings.   Subjective: Chief Complaint  Patient presents with   Neck - Pain    OTJI 2/365    HPI 65 year old male new patient visit for neck pain.  He reports an on-the-job injury in February 2022 when he was pulling a heavy load at work felt a pop in his neck and has had persistent pain in his neck into his left shoulder and sometimes down to midportion of his hand and fingers.  He still working every day still pulling heavy loads.  He states he always would at times.  He has been with the company for greater than  20 years.  Patient states has a caseworker not sure if they have assigned a case number to him yet.  He denies any gait problems.  He can walk stairs.  Previous cervical fusion by Dr. Leeroy Cha in 1999.  This was C4-C6 he may have had a long allograft since there is no screws in C5.  His fusion looks well incorporated.  Patient states since his surgery been doing well up until his injury concerning his neck and shoulders.  He does have type 2 diabetes some sleep apnea hyperlipidemia and hypertension.  Review of Systems 14 point system update otherwise negative as it pertains to HPI other than as mentioned above.   Objective: Vital Signs: Ht '5\' 9"'$  (1.753 m)   Wt 240 lb (108.9 kg)   BMI 35.44 kg/m   Physical Exam Constitutional:      Appearance: He is well-developed.  HENT:     Head: Normocephalic and atraumatic.     Right Ear: External ear normal.     Left Ear: External ear normal.  Eyes:     Pupils: Pupils are equal, round, and reactive to light.  Neck:     Thyroid: No thyromegaly.     Trachea: No tracheal deviation.  Cardiovascular:  Rate and Rhythm: Normal rate.  Pulmonary:     Effort: Pulmonary effort is normal.     Breath sounds: No wheezing.  Abdominal:     General: Bowel sounds are normal.     Palpations: Abdomen is soft.  Musculoskeletal:     Cervical back: Neck supple.  Skin:    General: Skin is warm and dry.     Capillary Refill: Capillary refill takes less than 2 seconds.  Neurological:     Mental Status: He is alert and oriented to person, place, and time.  Psychiatric:        Behavior: Behavior normal.        Thought Content: Thought content normal.        Judgment: Judgment normal.    Ortho Exam upper extremity reflexes are 1+ and symmetrical.  Biceps triceps is intact.  Negative impingement left shoulder.  He has brachial plexus tenderness on the left none on the right.  Positive Spurling on the left.  Rotation left limited 50% he has 80 to 90%  rotation of the right.  No lower extremity hyperreflexia no clonus normal heel toe gait.  Specialty Comments:  No specialty comments available.  Imaging: CLINICAL DATA:  Cervical stenosis.  Hyperflexed Thea.   EXAM: MRI CERVICAL SPINE WITHOUT CONTRAST   TECHNIQUE: Multiplanar, multisequence MR imaging of the cervical spine was performed. No intravenous contrast was administered.   COMPARISON:  None.   FINDINGS: Alignment: No significant listhesis is present. Straightening of the normal cervical lordosis is noted.   Vertebrae: Marrow signal changes associated with fusion C4-6 noted. Marrow signal and vertebral body heights otherwise normal.   Cord: Slight distortion of the ventral surface the cord noted at C3-4. Cord signal and morphology otherwise normal.   Posterior Fossa, vertebral arteries, paraspinal tissues: Craniocervical junction is normal. Flow is present in the vertebral arteries bilaterally. Visualized intracranial contents are normal.   Disc levels:   C2-3: Asymmetric left-sided uncovertebral and facet spurring result in severe left foraminal stenosis. Right foramen is patent.   C3-4: Adjacent level disease is present. A disc osteophyte complex effaces the ventral CSF. There is some distortion of the ventral surface the cord without abnormal signal. Moderate to severe foraminal stenosis is present bilaterally.   C4-5: Anterior fusion noted. Uncovertebral spurring contributes to moderate foraminal narrowing, right greater than left. There is partial effacement of ventral CSF.   C5-6: Anterior fusion noted. Osseous ridging partially effaces the ventral CSF. Uncovertebral spurring contributes to moderate foraminal narrowing bilaterally, left greater than right.   C6-7: A leftward disc osteophyte complex partially effaces the ventral CSF. Uncovertebral disease results in severe right and moderate left foraminal stenosis.   C7-T1: A broad-based disc  osteophyte complex partially effaces the ventral CSF. Uncovertebral spurring contributes to severe foraminal narrowing bilaterally.   IMPRESSION: 1. Anterior fusion at C4-5 and C5-6. 2. Moderate foraminal narrowing bilaterally at C4-5 and C5-6 is worse on the right. 3. Severe left foraminal stenosis at C2-3. 4. Moderate central and moderate to severe foraminal stenosis bilaterally at C3-4. 5. Severe right and moderate left foraminal stenosis at C6-7. 6. Severe bilateral foraminal stenosis at C7-T1.     Electronically Signed   By: San Morelle M.D.   On: 10/05/2020 13:23   PMFS History: Patient Active Problem List   Diagnosis Date Noted   Hx of fusion of cervical spine 12/05/2020   Foraminal stenosis of cervical region 12/05/2020   Type 2 diabetes mellitus ([place]) 09/27/2012   Essential  hypertension 09/27/2012   Hyperlipidemia 09/27/2012   Obstructive sleep apnea 09/27/2012   Chest pain 09/27/2012   Past Medical History:  Diagnosis Date   Allergy    Anxiety    Asthma    Chest pain 09/03/2012   stress test see CV procedures, negative, echo with EF 55-60%    COVID-19 dec2020   body aches, diarrhea and cough for a few days all symptoms resolved   DM type 2 (diabetes mellitus, type 2) (HCC)    GERD (gastroesophageal reflux disease)    Headache    migraines   Hyperlipidemia 09/03/2012   stress test - see CV procedures   Hypertension    Obesity    OSA on CPAP    Sleep apnea    WEARS CPAP    Family History  Problem Relation Age of Onset   Pneumonia Father    Colon cancer Sister    Aneurysm Brother    Melanoma Brother    Colon polyps Brother    Esophageal cancer Neg Hx    Stomach cancer Neg Hx    Rectal cancer Neg Hx     Past Surgical History:  Procedure Laterality Date   APPENDECTOMY  age 76   cervical neck fusion  age 66's   CHOLECYSTECTOMY  27's   MASS EXCISION N/A 11/04/2019   Procedure: EXCISION OF SCROTAL LESION;  Surgeon: Ceasar Mons, MD;  Location: [facility];  Service: Urology;  Laterality: N/A;   Social History   Occupational History   Not on file  Tobacco Use   Smoking status: Former    Types: Cigars   Smokeless tobacco: Never   Tobacco comments:    used to smoke an occasional cigar  Vaping Use   Vaping Use: Never used  Substance and Sexual Activity   Alcohol use: Yes    Comment: socially   Drug use: Never   Sexual activity: Not on file

## 2020-12-05 DIAGNOSIS — Z981 Arthrodesis status: Secondary | ICD-10-CM | POA: Insufficient documentation

## 2020-12-05 DIAGNOSIS — M4802 Spinal stenosis, cervical region: Secondary | ICD-10-CM | POA: Insufficient documentation

## 2020-12-10 ENCOUNTER — Telehealth: Payer: Self-pay | Admitting: Gastroenterology

## 2020-12-10 ENCOUNTER — Telehealth: Payer: Self-pay | Admitting: Orthopaedic Surgery

## 2020-12-10 NOTE — Telephone Encounter (Signed)
Have you heard anything from [employer] regarding patient?

## 2020-12-10 NOTE — Telephone Encounter (Signed)
I left voicemail requesting return call. CB number is [phone].

## 2020-12-10 NOTE — Telephone Encounter (Signed)
I have advised the pt's wife of the path results below.  No further questions.    Dear Andrew Dyer,   The majority of the polyps (4 of 7) that I removed during your recent procedure were proven to be adenomatous.  These are considered to be pre-cancerous polyps that may have grown into cancers if they had not been removed.  Studies show that at least 20% of women over age 63 and 30% of men over age 37 have pre-cancerous polyps. Based on current nationally recognized surveillanceguidelines, I recommend that you have a repeat colonoscopy in 3 years.    If you develop any new rectal bleeding, abdominal pain or significant bowelhabit changes, please contact me before then.   If you have any questions about this recommendation otherwise, please do nothesitate to contact me.   Fairlea Cellar, MD

## 2020-12-10 NOTE — Telephone Encounter (Signed)
Pt wife called asking about the physical therapy referral. She wants someone to call her.   CB [phone]

## 2020-12-10 NOTE — Telephone Encounter (Signed)
Patients wife called for path results

## 2020-12-12 NOTE — Telephone Encounter (Signed)
Patient's wife called again this morning and was questioning whether we had heard anything from work comp in regards to [employer] approval. I explained that Andrew Dyer had a name and has reached out with no response yet. I told her that I would send message back to you to see if you have heard anything up until this point. I will call patient's wife back this afternoon with update. Just let me know. Thanks.

## 2020-12-14 NOTE — Telephone Encounter (Signed)
I called to give update to patient's wife on status of contacting [employer]. I explained that Judie Petit has tried multiple times and days to reach Vickki Muff, through phone calls, emails, and fax, with no results or call back. Patient's wife will call her contact at the plant on Monday to let her know what is happening and see if she can find out any information that way. She will return call after speaking with them.

## 2020-12-27 NOTE — Telephone Encounter (Signed)
Noted. Thanks.

## 2021-01-08 ENCOUNTER — Other Ambulatory Visit: Payer: Self-pay

## 2021-01-08 ENCOUNTER — Encounter: Payer: Self-pay | Admitting: Orthopaedic Surgery

## 2021-01-08 ENCOUNTER — Ambulatory Visit (INDEPENDENT_AMBULATORY_CARE_PROVIDER_SITE_OTHER): Payer: No Typology Code available for payment source | Admitting: Orthopaedic Surgery

## 2021-01-08 VITALS — BP 159/83 | HR 58 | Ht 69.0 in | Wt 240.0 lb

## 2021-01-08 DIAGNOSIS — M4802 Spinal stenosis, cervical region: Secondary | ICD-10-CM

## 2021-01-08 NOTE — Progress Notes (Signed)
Office Visit Note   Patient: Andrew Dyer           Date of Birth: 22-Dec-1955           MRN: [id number] Visit Date: 01/08/2021              Requested by: Scotty Court, DO [name] [place],  [place] [address] PCP: Scotty Court, DO   Assessment & Plan: Visit Diagnoses:  1. Foraminal stenosis of cervical region     Plan: Patient previous cervical fusion C4-C6 is solid.  He has severe left foraminal stenosis at C2-3 moderate central and severe foraminal stenosis bilaterally at C3-4 and foraminal stenosis at C6-7.  Left disc osteophyte complex at C6-7 may be a symptomatic level or it may be above his fusion.  Patient needs to get some therapy to get improvement.  I plan to check him in a month.  Follow-Up Instructions: Return in about 1 month (around 02/08/2021).   Orders:  No orders of the defined types were placed in this encounter.  No orders of the defined types were placed in this encounter.     Procedures: No procedures performed   Clinical Data: No additional findings.   Subjective: Chief Complaint  Patient presents with   Neck - Pain, Follow-up    HPI 65 year old male seen with ongoing problems with neck pain.  He still working still having discomfort.  I saw him 12/04/2020.  Therapy has been ordered for treatment of his on-the-job injury in February.  He states he has not heard anything from therapy and its still pending.  He states he is not any better he is taking Tylenol for pain and has some problems with sleeping.  He still going to work every day.  Review of Systems 14 point system update unchanged from 12/04/2020.   Objective: Vital Signs: BP (!) 159/83   Pulse (!) 58   Ht '5\' 9"'$  (1.753 m)   Wt 240 lb (108.9 kg)   BMI 35.44 kg/m   Physical Exam Constitutional:      Appearance: He is well-developed.  HENT:     Head: Normocephalic and atraumatic.     Right Ear: External ear normal.     Left Ear: External ear normal.  Eyes:      Pupils: Pupils are equal, round, and reactive to light.  Neck:     Thyroid: No thyromegaly.     Trachea: No tracheal deviation.  Cardiovascular:     Rate and Rhythm: Normal rate.  Pulmonary:     Effort: Pulmonary effort is normal.     Breath sounds: No wheezing.  Abdominal:     General: Bowel sounds are normal.     Palpations: Abdomen is soft.  Musculoskeletal:     Cervical back: Neck supple.  Skin:    General: Skin is warm and dry.     Capillary Refill: Capillary refill takes less than 2 seconds.  Neurological:     Mental Status: He is alert and oriented to person, place, and time.  Psychiatric:        Behavior: Behavior normal.        Thought Content: Thought content normal.        Judgment: Judgment normal.    Ortho ExamCervical rotation right and left.  Symmetric upper extremity reflexes 1+.  Normal heel toe gait.  Brachial plexus tenderness on the left none on the right.  Specialty Comments:  No specialty comments available.  Imaging: No results found.   PMFS History: Patient Active Problem List   Diagnosis Date Noted   Hx of fusion of cervical spine 12/05/2020   Foraminal stenosis of cervical region 12/05/2020   Type 2 diabetes mellitus ([place]) 09/27/2012   Essential hypertension 09/27/2012   Hyperlipidemia 09/27/2012   Obstructive sleep apnea 09/27/2012   Chest pain 09/27/2012   Past Medical History:  Diagnosis Date   Allergy    Anxiety    Asthma    Chest pain 09/03/2012   stress test see CV procedures, negative, echo with EF 55-60%    COVID-19 dec2020   body aches, diarrhea and cough for a few days all symptoms resolved   DM type 2 (diabetes mellitus, type 2) (HCC)    GERD (gastroesophageal reflux disease)    Headache    migraines   Hyperlipidemia 09/03/2012   stress test - see CV procedures   Hypertension    Obesity    OSA on CPAP    Sleep apnea    WEARS CPAP    Family History  Problem Relation Age of Onset   Pneumonia Father    Colon cancer  Sister    Aneurysm Brother    Melanoma Brother    Colon polyps Brother    Esophageal cancer Neg Hx    Stomach cancer Neg Hx    Rectal cancer Neg Hx     Past Surgical History:  Procedure Laterality Date   APPENDECTOMY  age 59   cervical neck fusion  age 67's   CHOLECYSTECTOMY  64's   MASS EXCISION N/A 11/04/2019   Procedure: EXCISION OF SCROTAL LESION;  Surgeon: Ceasar Mons, MD;  Location: [facility];  Service: Urology;  Laterality: N/A;   Social History   Occupational History   Not on file  Tobacco Use   Smoking status: Former    Types: Cigars   Smokeless tobacco: Never   Tobacco comments:    used to smoke an occasional cigar  Vaping Use   Vaping Use: Never used  Substance and Sexual Activity   Alcohol use: Yes    Comment: socially   Drug use: Never   Sexual activity: Not on file

## 2021-01-09 ENCOUNTER — Telehealth: Payer: Self-pay

## 2021-01-09 NOTE — Telephone Encounter (Signed)
Ann called requesting office visits note for  Feb 4th to Aug 30th   Fax # [phone]

## 2021-02-08 ENCOUNTER — Ambulatory Visit: Payer: Commercial Managed Care - PPO | Admitting: Orthopaedic Surgery

## 2021-02-19 ENCOUNTER — Ambulatory Visit (INDEPENDENT_AMBULATORY_CARE_PROVIDER_SITE_OTHER): Payer: No Typology Code available for payment source | Admitting: Orthopaedic Surgery

## 2021-02-19 ENCOUNTER — Other Ambulatory Visit: Payer: Self-pay

## 2021-02-19 VITALS — BP 165/91 | HR 84

## 2021-02-19 DIAGNOSIS — Z981 Arthrodesis status: Secondary | ICD-10-CM

## 2021-02-19 DIAGNOSIS — M4802 Spinal stenosis, cervical region: Secondary | ICD-10-CM | POA: Diagnosis not present

## 2021-02-19 NOTE — Progress Notes (Signed)
Office Visit Note   Patient: Andrew Dyer           Date of Birth: February 10, 1956           MRN: [id number] Visit Date: 02/19/2021              Requested by: Scotty Court, DO [name] [place],  [place] [id number] PCP: Scotty Court, DO   Assessment & Plan: Visit Diagnoses:  1. Foraminal stenosis of cervical region   2. Hx of fusion of cervical spine     Plan: Patient doing well with therapy will finish up her therapy visits continue working on weight loss.  Transition home exercise program for his neck and I will follow-up on an as-needed basis.  Follow-Up Instructions: Return if symptoms worsen or fail to improve.   Orders:  No orders of the defined types were placed in this encounter.  No orders of the defined types were placed in this encounter.     Procedures: No procedures performed   Clinical Data: No additional findings.   Subjective: Chief Complaint  Patient presents with   Neck - Follow-up    HPIFollow-up previous cervical fusion C4-C6 with foraminal stenosis.  He has been going to physical therapy and states he is greater than 50% pain relief.  He states he has been working on weight loss and has lost 5 pounds.  Patient does have diabetes and we discussed benefit of weight loss with diabetes hypertension etc.  Review of Systems 14 point system update unchanged from 12/04/2020.   Objective: Vital Signs: BP (!) 165/91   Pulse 84   Physical Exam Constitutional:      Appearance: He is well-developed.  HENT:     Head: Normocephalic and atraumatic.     Right Ear: External ear normal.     Left Ear: External ear normal.  Eyes:     Pupils: Pupils are equal, round, and reactive to light.  Neck:     Thyroid: No thyromegaly.     Trachea: No tracheal deviation.  Cardiovascular:     Rate and Rhythm: Normal rate.  Pulmonary:     Effort: Pulmonary effort is normal.     Breath sounds: No wheezing.  Abdominal:     General: Bowel sounds are  normal.     Palpations: Abdomen is soft.  Musculoskeletal:     Cervical back: Neck supple.  Skin:    General: Skin is warm and dry.     Capillary Refill: Capillary refill takes less than 2 seconds.  Neurological:     Mental Status: He is alert and oriented to person, place, and time.  Psychiatric:        Behavior: Behavior normal.        Thought Content: Thought content normal.        Judgment: Judgment normal.    Ortho Exam normal heel toe gait.  Is able to toe walk heel walk.  Good cervical range of motion with improved rotation right and left 80% normal.  Specialty Comments:  No specialty comments available.  Imaging: No results found.   PMFS History: Patient Active Problem List   Diagnosis Date Noted   Hx of fusion of cervical spine 12/05/2020   Foraminal stenosis of cervical region 12/05/2020   Type 2 diabetes mellitus ([place]) 09/27/2012   Essential hypertension 09/27/2012   Hyperlipidemia 09/27/2012   Obstructive sleep apnea 09/27/2012   Chest pain 09/27/2012   Past Medical History:  Diagnosis Date   Allergy    Anxiety    Asthma    Chest pain 09/03/2012   stress test see CV procedures, negative, echo with EF 55-60%    COVID-19 dec2020   body aches, diarrhea and cough for a few days all symptoms resolved   DM type 2 (diabetes mellitus, type 2) (HCC)    GERD (gastroesophageal reflux disease)    Headache    migraines   Hyperlipidemia 09/03/2012   stress test - see CV procedures   Hypertension    Obesity    OSA on CPAP    Sleep apnea    WEARS CPAP    Family History  Problem Relation Age of Onset   Pneumonia Father    Colon cancer Sister    Aneurysm Brother    Melanoma Brother    Colon polyps Brother    Esophageal cancer Neg Hx    Stomach cancer Neg Hx    Rectal cancer Neg Hx     Past Surgical History:  Procedure Laterality Date   APPENDECTOMY  age 37   cervical neck fusion  age 52's   CHOLECYSTECTOMY  55's   MASS EXCISION N/A 11/04/2019    Procedure: EXCISION OF SCROTAL LESION;  Surgeon: Ceasar Mons, MD;  Location: [facility];  Service: Urology;  Laterality: N/A;   Social History   Occupational History   Not on file  Tobacco Use   Smoking status: Former    Types: Cigars   Smokeless tobacco: Never   Tobacco comments:    used to smoke an occasional cigar  Vaping Use   Vaping Use: Never used  Substance and Sexual Activity   Alcohol use: Yes    Comment: socially   Drug use: Never   Sexual activity: Not on file

## 2021-02-20 ENCOUNTER — Telehealth: Payer: Self-pay | Admitting: Orthopaedic Surgery

## 2021-02-20 NOTE — Telephone Encounter (Signed)
Shane pts WC case manager called wanting to get the pts work status. She would like a CB.   [phone]

## 2021-02-20 NOTE — Telephone Encounter (Signed)
Please advise 

## 2021-02-21 NOTE — Telephone Encounter (Signed)
FYI

## 2023-05-04 IMAGING — MR MR CERVICAL SPINE W/O CM
5 series · 34 of 48 positions shown · non-contrast
Comparison: None.

CLINICAL DATA: Cervical stenosis.  Hyperflexed Yamamoto.

EXAM:
MRI CERVICAL SPINE WITHOUT CONTRAST
TECHNIQUE: Multiplanar, multisequence MR imaging of the cervical spine was
performed. No intravenous contrast was administered.

[Series 2: T2 · sagittal · 3.0mm · 0.41mm/px · 8 of 17 slices shown (1 of 2)]
[im 1/17]
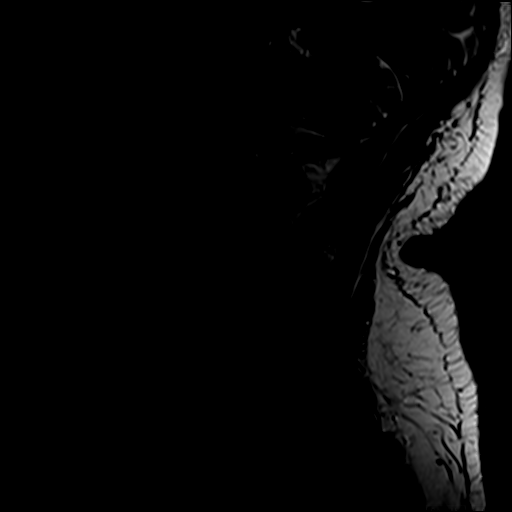
[im 3/17]
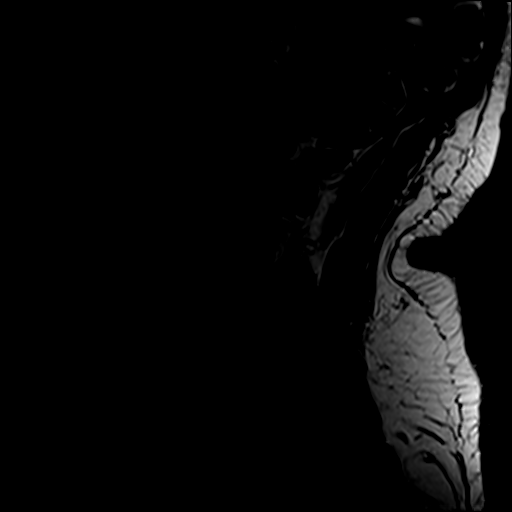
[im 5/17]
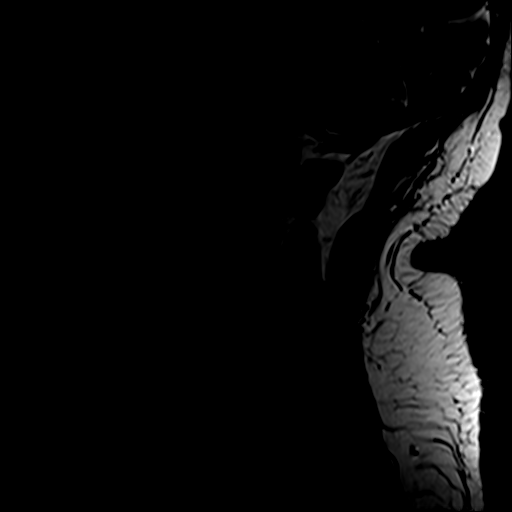
[im 7/17]
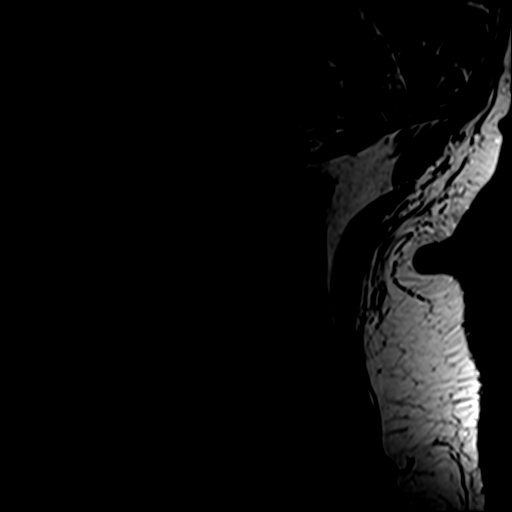
[im 10/17]
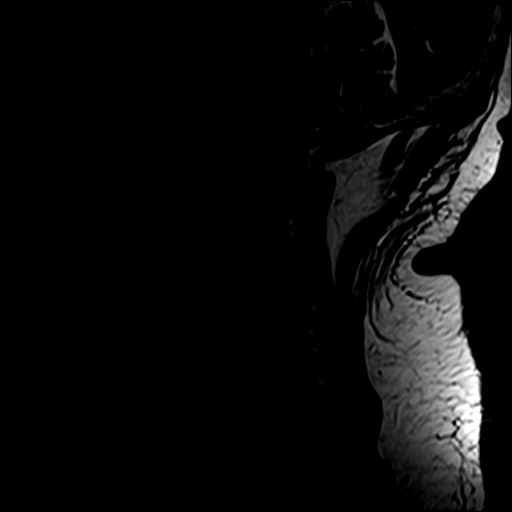
[im 12/17]
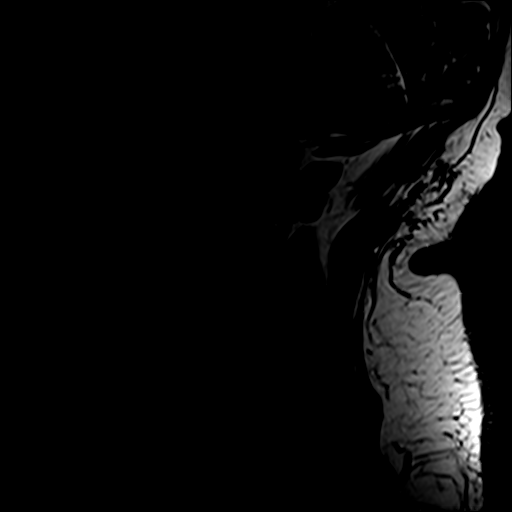
[im 14/17]
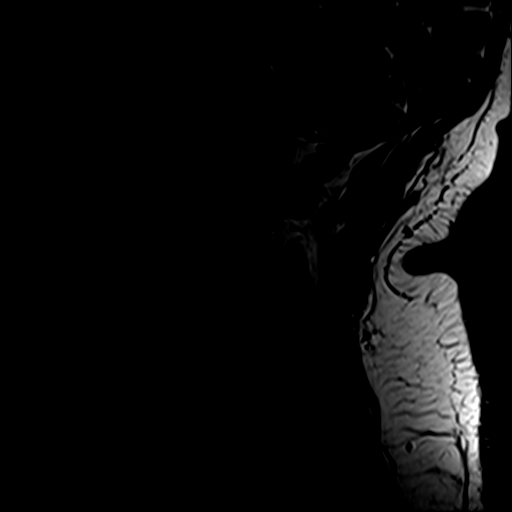
[im 17/17]
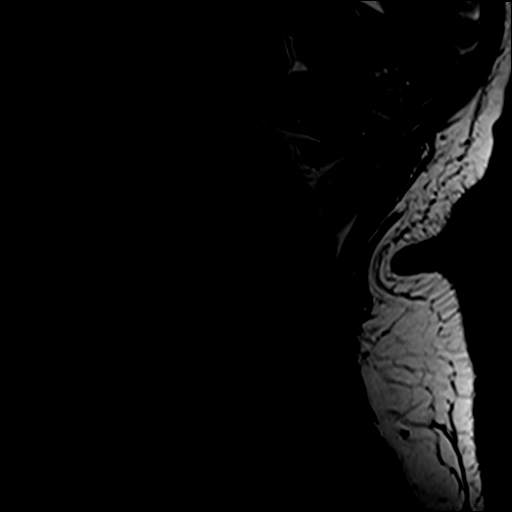

[Series 3: STIR · sagittal · 3.0mm · 0.82mm/px · 7 of 17 slices shown]
[im 1/17]
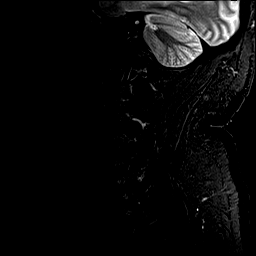
[im 3/17]
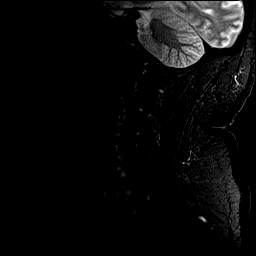
[im 6/17]
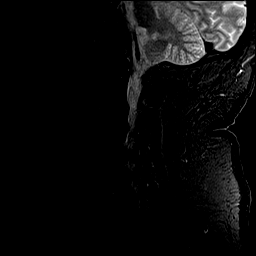
[im 9/17]
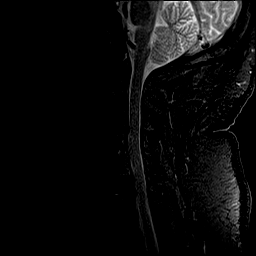
[im 11/17]
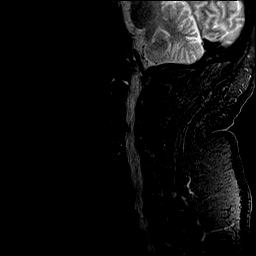
[im 14/17]
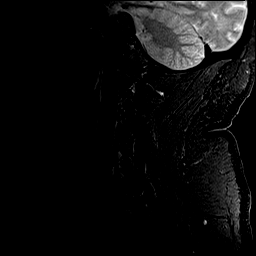
[im 17/17]
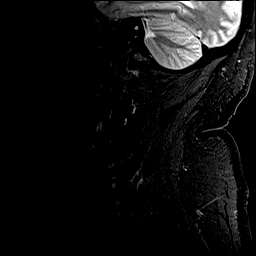

[Series 4: T1 · sagittal · 3.0mm · 0.82mm/px · 7 of 17 slices shown]
[im 1/17]
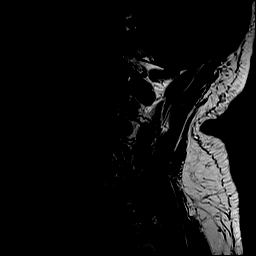
[im 3/17]
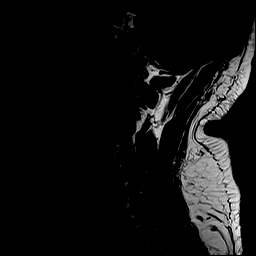
[im 6/17]
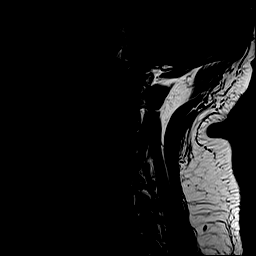
[im 9/17]
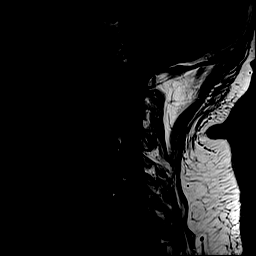
[im 11/17]
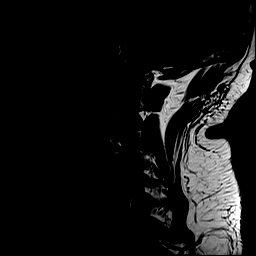
[im 14/17]
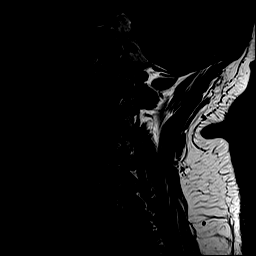
[im 17/17]
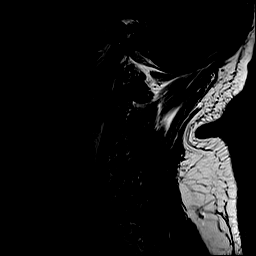

[Series 5: T2 · axial · 3.0mm · 0.70mm/px · z∈[-67,+39]mm · 9 of 30 slices shown (2 of 2)]
[im 1/30]
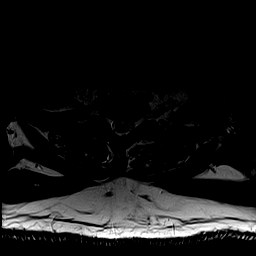
[im 5/30]
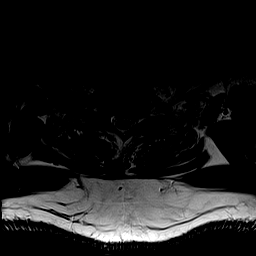
[im 10/30]
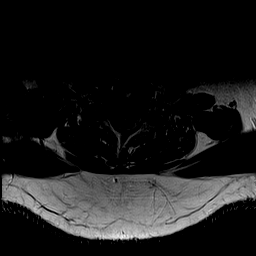
[im 13/30]
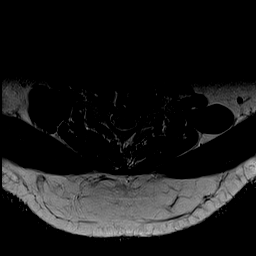
[im 15/30]
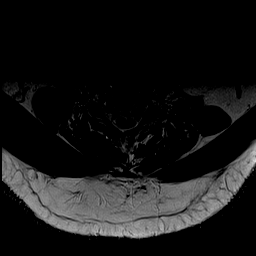
[im 17/30]
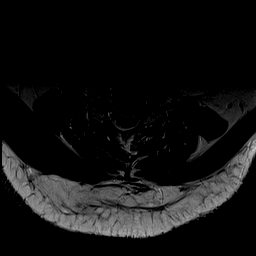
[im 20/30]
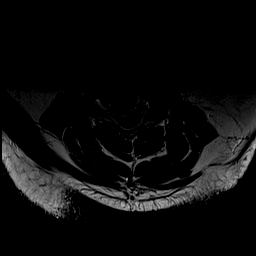
[im 25/30]
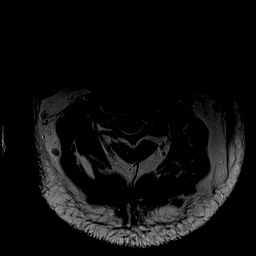
[im 30/30]
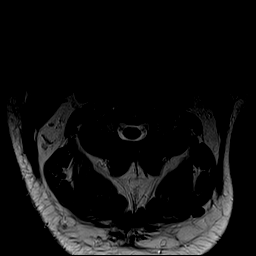

[Series 6: GRE · axial · 3.0mm · 0.35mm/px · z∈[-67,-34]mm · 3 of 30 slices shown]
[im 1/30]
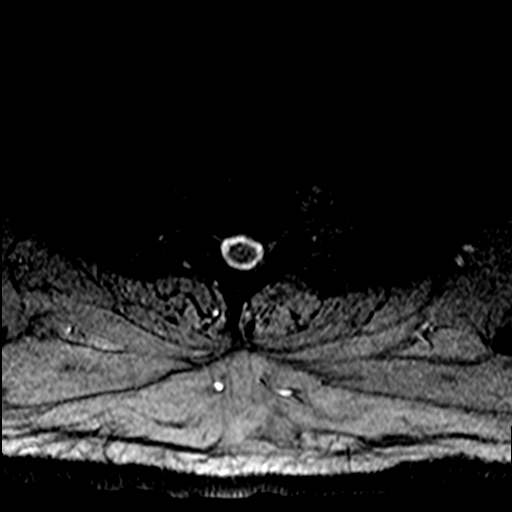
[im 5/30]
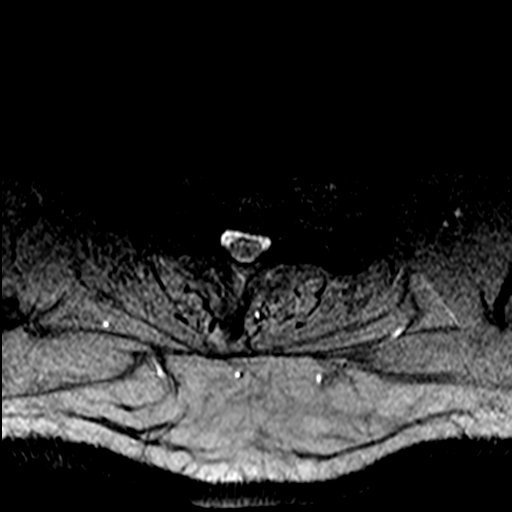
[im 10/30]
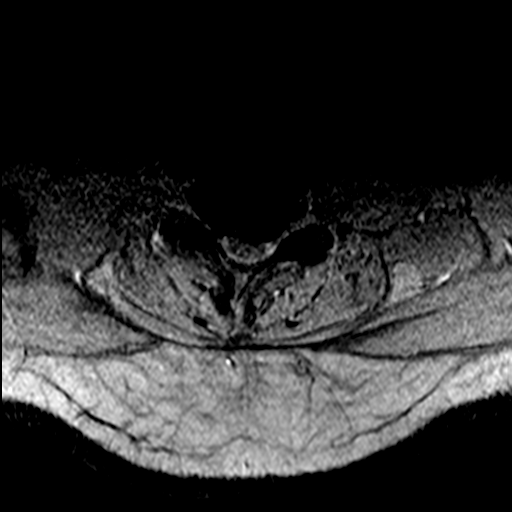

[34 of 48 positions shown; findings below may reference images not displayed]

FINDINGS: Alignment: No significant listhesis is present. Straightening of the
normal cervical lordosis is noted.

Vertebrae: Marrow signal changes associated with fusion C4-6 noted.
Marrow signal and vertebral body heights otherwise normal.

Cord: Slight distortion of the ventral surface the cord noted at
C3-4. Cord signal and morphology otherwise normal.

Posterior Fossa, vertebral arteries, paraspinal tissues:
Craniocervical junction is normal. Flow is present in the vertebral
arteries bilaterally. Visualized intracranial contents are normal.

Disc levels:

C2-3: Asymmetric left-sided uncovertebral and facet spurring result
in severe left foraminal stenosis. Right foramen is patent.

C3-4: Adjacent level disease is present. A disc osteophyte complex
effaces the ventral CSF. There is some distortion of the ventral
surface the cord without abnormal signal. Moderate to severe
foraminal stenosis is present bilaterally.

C4-5: Anterior fusion noted. Uncovertebral spurring contributes to
moderate foraminal narrowing, right greater than left. There is
partial effacement of ventral CSF.

C5-6: Anterior fusion noted. Osseous ridging partially effaces the
ventral CSF. Uncovertebral spurring contributes to moderate
foraminal narrowing bilaterally, left greater than right.

C6-7: A leftward disc osteophyte complex partially effaces the
ventral CSF. Uncovertebral disease results in severe right and
moderate left foraminal stenosis.

C7-T1: A broad-based disc osteophyte complex partially effaces the
ventral CSF. Uncovertebral spurring contributes to severe foraminal
narrowing bilaterally.
IMPRESSION: 1. Anterior fusion at C4-5 and C5-6.
2. Moderate foraminal narrowing bilaterally at C4-5 and C5-6 is
worse on the right.
3. Severe left foraminal stenosis at C2-3.
4. Moderate central and moderate to severe foraminal stenosis
bilaterally at C3-4.
5. Severe right and moderate left foraminal stenosis at C6-7.
6. Severe bilateral foraminal stenosis at C7-T1.
